# Patient Record
Sex: Female | Born: 1980
Health system: Southern US, Community
[De-identification: ages and names within clinical notes are randomized; demographics above are authoritative.]

## PROBLEM LIST (undated history)

## (undated) DIAGNOSIS — N870 Mild cervical dysplasia: Secondary | ICD-10-CM

## (undated) DIAGNOSIS — D649 Anemia, unspecified: Secondary | ICD-10-CM

## (undated) DIAGNOSIS — B977 Papillomavirus as the cause of diseases classified elsewhere: Secondary | ICD-10-CM

## (undated) HISTORY — DX: Papillomavirus as the cause of diseases classified elsewhere: B97.7

## (undated) HISTORY — DX: Mild cervical dysplasia: N87.0

## (undated) HISTORY — DX: Anemia, unspecified: D64.9

---

## 2004-10-29 ENCOUNTER — Other Ambulatory Visit: Admission: RE | Admit: 2004-10-29 | Discharge: 2004-10-29 | Payer: Self-pay | Admitting: Gynecology

## 2005-02-09 ENCOUNTER — Other Ambulatory Visit: Admission: RE | Admit: 2005-02-09 | Discharge: 2005-02-09 | Payer: Self-pay | Admitting: Gynecology

## 2005-05-28 ENCOUNTER — Inpatient Hospital Stay (HOSPITAL_COMMUNITY): Admission: AD | Admit: 2005-05-28 | Discharge: 2005-05-28 | Payer: Self-pay | Admitting: Gynecology

## 2005-06-02 ENCOUNTER — Inpatient Hospital Stay (HOSPITAL_COMMUNITY): Admission: AD | Admit: 2005-06-02 | Discharge: 2005-06-04 | Payer: Self-pay | Admitting: Gynecology

## 2005-07-13 ENCOUNTER — Other Ambulatory Visit: Admission: RE | Admit: 2005-07-13 | Discharge: 2005-07-13 | Payer: Self-pay | Admitting: Gynecology

## 2006-07-26 ENCOUNTER — Other Ambulatory Visit: Admission: RE | Admit: 2006-07-26 | Discharge: 2006-07-26 | Payer: Self-pay | Admitting: Gynecology

## 2006-08-03 ENCOUNTER — Encounter: Admission: RE | Admit: 2006-08-03 | Discharge: 2006-08-03 | Payer: Self-pay | Admitting: Gynecology

## 2006-08-03 ENCOUNTER — Encounter (INDEPENDENT_AMBULATORY_CARE_PROVIDER_SITE_OTHER): Payer: Self-pay | Admitting: Specialist

## 2006-11-13 ENCOUNTER — Other Ambulatory Visit: Admission: RE | Admit: 2006-11-13 | Discharge: 2006-11-13 | Payer: Self-pay | Admitting: Gynecology

## 2007-08-06 ENCOUNTER — Other Ambulatory Visit: Admission: RE | Admit: 2007-08-06 | Discharge: 2007-08-06 | Payer: Self-pay | Admitting: Gynecology

## 2008-08-14 ENCOUNTER — Ambulatory Visit: Payer: Self-pay | Admitting: Gynecology

## 2008-08-14 ENCOUNTER — Other Ambulatory Visit: Admission: RE | Admit: 2008-08-14 | Discharge: 2008-08-14 | Payer: Self-pay | Admitting: Gynecology

## 2008-08-14 ENCOUNTER — Encounter: Payer: Self-pay | Admitting: Gynecology

## 2009-07-08 ENCOUNTER — Ambulatory Visit: Payer: Self-pay | Admitting: Gynecology

## 2009-07-13 ENCOUNTER — Encounter: Admission: RE | Admit: 2009-07-13 | Discharge: 2009-07-13 | Payer: Self-pay | Admitting: Gynecology

## 2009-09-01 ENCOUNTER — Other Ambulatory Visit: Admission: RE | Admit: 2009-09-01 | Discharge: 2009-09-01 | Payer: Self-pay | Admitting: Gynecology

## 2009-09-01 ENCOUNTER — Ambulatory Visit: Payer: Self-pay | Admitting: Gynecology

## 2009-11-17 ENCOUNTER — Ambulatory Visit: Payer: Self-pay | Admitting: Gynecology

## 2010-08-14 ENCOUNTER — Emergency Department (INDEPENDENT_AMBULATORY_CARE_PROVIDER_SITE_OTHER): Payer: Managed Care, Other (non HMO)

## 2010-08-14 ENCOUNTER — Emergency Department (HOSPITAL_BASED_OUTPATIENT_CLINIC_OR_DEPARTMENT_OTHER)
Admission: EM | Admit: 2010-08-14 | Discharge: 2010-08-14 | Disposition: A | Discharge status: Home / Self Care | Payer: Managed Care, Other (non HMO) | Attending: Emergency Medicine | Admitting: Emergency Medicine

## 2010-08-14 DIAGNOSIS — R079 Chest pain, unspecified: Secondary | ICD-10-CM | POA: Insufficient documentation

## 2010-08-14 LAB — URINE MICROSCOPIC-ADD ON

## 2010-08-14 LAB — POCT CARDIAC MARKERS
CKMB, poc: <1 ng/mL — ABNORMAL LOW (ref 1.0–8.0)
CKMB, poc: <1 ng/mL — ABNORMAL LOW (ref 1.0–8.0)
Myoglobin, poc: 22 ng/mL (ref 12–200)
Myoglobin, poc: 14.8 ng/mL (ref 12–200)
Troponin i, poc: <0.05 ng/mL (ref 0.00–0.09)
Troponin i, poc: <0.05 ng/mL (ref 0.00–0.09)

## 2010-08-14 LAB — CBC
HCT: 36.6 % (ref 36.0–46.0)
Hemoglobin: 12.8 g/dL (ref 12.0–15.0)
MCH: 30.6 pg (ref 26.0–34.0)
MCHC: 35 g/dL (ref 30.0–36.0)
MCV: 87.6 fL (ref 78.0–100.0)
Platelets: 231 10*3/uL (ref 150–400)
RBC: 4.18 MIL/uL (ref 3.87–5.11)
RDW: 11.6 % (ref 11.5–15.5)
WBC: 8.3 10*3/uL (ref 4.0–10.5)

## 2010-08-14 LAB — DIFFERENTIAL
Basophils Absolute: 0 10*3/uL (ref 0.0–0.1)
Basophils Relative: 0 % (ref 0–1)
Eosinophils Absolute: 0.4 10*3/uL (ref 0.0–0.7)
Eosinophils Relative: 5 % (ref 0–5)
Lymphocytes Relative: 49 % — ABNORMAL HIGH (ref 12–46)
Lymphs Abs: 4 10*3/uL (ref 0.7–4.0)
Monocytes Absolute: 0.7 10*3/uL (ref 0.1–1.0)
Monocytes Relative: 8 % (ref 3–12)
Neutro Abs: 3.2 10*3/uL (ref 1.7–7.7)
Neutrophils Relative %: 39 % — ABNORMAL LOW (ref 43–77)

## 2010-08-14 LAB — COMPREHENSIVE METABOLIC PANEL
ALT: 32 U/L (ref 0–35)
AST: 25 U/L (ref 0–37)
Albumin: 4.7 g/dL (ref 3.5–5.2)
Alkaline Phosphatase: 85 U/L (ref 39–117)
BUN: 11 mg/dL (ref 6–23)
CO2: 27 mEq/L (ref 19–32)
Calcium: 9.3 mg/dL (ref 8.4–10.5)
Chloride: 104 mEq/L (ref 96–112)
Creatinine, Ser: 0.6 mg/dL (ref 0.4–1.2)
GFR calc Af Amer: >60 mL/min (ref >60)
GFR calc non Af Amer: >60 mL/min (ref >60)
Glucose, Bld: 101 mg/dL — ABNORMAL HIGH (ref 70–99)
Potassium: 3.4 mEq/L — ABNORMAL LOW (ref 3.5–5.1)
Sodium: 143 mEq/L (ref 135–145)
Total Bilirubin: 0.7 mg/dL (ref 0.3–1.2)
Total Protein: 8.6 g/dL — ABNORMAL HIGH (ref 6.0–8.3)

## 2010-08-14 LAB — URINALYSIS, ROUTINE W REFLEX MICROSCOPIC
Bilirubin Urine: NEGATIVE
Glucose, UA: NEGATIVE mg/dL
Ketones, ur: NEGATIVE mg/dL
Nitrite: NEGATIVE
Protein, ur: NEGATIVE mg/dL
Specific Gravity, Urine: 1.007 (ref 1.005–1.030)
Urobilinogen, UA: 0.2 mg/dL (ref 0.0–1.0)
pH: 8 (ref 5.0–8.0)

## 2010-08-14 LAB — PREGNANCY, URINE: Preg Test, Ur: NEGATIVE

## 2010-09-20 ENCOUNTER — Encounter: Payer: Self-pay | Admitting: Gynecology

## 2010-09-22 ENCOUNTER — Other Ambulatory Visit (HOSPITAL_COMMUNITY)
Admission: RE | Admit: 2010-09-22 | Discharge: 2010-09-22 | Disposition: A | Discharge status: Home / Self Care | Payer: Managed Care, Other (non HMO) | Source: Ambulatory Visit | Attending: Gynecology | Admitting: Gynecology

## 2010-09-22 ENCOUNTER — Encounter (INDEPENDENT_AMBULATORY_CARE_PROVIDER_SITE_OTHER): Payer: Managed Care, Other (non HMO) | Admitting: Gynecology

## 2010-09-22 ENCOUNTER — Other Ambulatory Visit: Payer: Self-pay | Admitting: Gynecology

## 2010-09-22 DIAGNOSIS — Z124 Encounter for screening for malignant neoplasm of cervix: Secondary | ICD-10-CM | POA: Insufficient documentation

## 2010-09-22 DIAGNOSIS — J069 Acute upper respiratory infection, unspecified: Secondary | ICD-10-CM

## 2010-09-22 DIAGNOSIS — L680 Hirsutism: Secondary | ICD-10-CM

## 2010-09-22 DIAGNOSIS — Z01419 Encounter for gynecological examination (general) (routine) without abnormal findings: Secondary | ICD-10-CM

## 2010-09-22 DIAGNOSIS — Z131 Encounter for screening for diabetes mellitus: Secondary | ICD-10-CM

## 2010-09-22 DIAGNOSIS — Z833 Family history of diabetes mellitus: Secondary | ICD-10-CM

## 2010-10-08 NOTE — H&P (Signed)
NAME:  Stephanie Mcdonald, Stephanie Mcdonald              ACCOUNT NO.:  [id number]   MEDICAL RECORD NO.:  [id number]          PATIENT TYPE:  MAT   LOCATION:  [facility]                          FACILITY:  [facility]   PHYSICIAN:  Timothy P. Fontaine, M.D.DATE OF BIRTH:  December 10, 1980   DATE OF ADMISSION:  06/02/2005  DATE OF DISCHARGE:                                HISTORY & PHYSICAL   CHIEF COMPLAINT:  Labor.   HISTORY OF PRESENT ILLNESS:  A 30 year old G2 P1 female at [redacted] weeks  gestation who enters with regular uterine contractions. She has no rupture  of membranes. Her prenatal course has been uncomplicated, noting a low-grade  SIL Pap smear with colposcopic confirmed biopsies first trimester, planned  postpartum follow-up. Her beta strep screen is negative. For the remainder  of her history, see her [facility].   PHYSICAL EXAMINATION:  HEENT:  Normal.  LUNGS:  Clear.  CARDIAC:  Regular rate, no rubs, murmurs, or gallops.  ABDOMEN:  Gravid, vertex fetus consistent with term. External monitors show  reactive fetal tracing with contractions every 5 minutes.  PELVIC:  Per nursing personnel, vertex presentation, cervix 3 cm dilated,  50% effaced, 0 station.   ASSESSMENT:  A 30 year old gravida 2 para 1 female at [redacted] weeks gestation,  early labor, contractions every 5 minutes, 3 cm dilatation, intact  membranes, beta strep negative. Will plan on admission, routine labor and  delivery orders.      Timothy P. Fontaine, M.D.  Electronically Signed     TPF/MEDQ  D:  06/02/2005  T:  06/02/2005  Job:  [id number]

## 2011-09-20 ENCOUNTER — Telehealth: Payer: Self-pay | Admitting: *Deleted

## 2011-09-20 ENCOUNTER — Other Ambulatory Visit: Payer: Self-pay | Admitting: *Deleted

## 2011-09-20 DIAGNOSIS — Z3049 Encounter for surveillance of other contraceptives: Secondary | ICD-10-CM

## 2011-09-20 MED ORDER — LEVONORGESTREL 20 MCG/24HR IU IUD: INTRAUTERINE_SYSTEM | Freq: Once | INTRAUTERINE | Status: DC

## 2011-09-20 NOTE — Telephone Encounter (Signed)
Patient informed Mirena IUD and insert is covered at 100%, but the removal is covered with a $25 copay.  Will collect that at the time she comes in.

## 2011-09-30 ENCOUNTER — Encounter: Payer: Managed Care, Other (non HMO) | Admitting: Gynecology

## 2011-10-07 ENCOUNTER — Encounter: Payer: Self-pay | Admitting: Gynecology

## 2011-10-07 ENCOUNTER — Ambulatory Visit (INDEPENDENT_AMBULATORY_CARE_PROVIDER_SITE_OTHER): Payer: BC Managed Care – PPO | Admitting: Gynecology

## 2011-10-07 VITALS — BP 118/70 | Ht 64.0 in | Wt 141.0 lb

## 2011-10-07 DIAGNOSIS — Z87898 Personal history of other specified conditions: Secondary | ICD-10-CM

## 2011-10-07 DIAGNOSIS — R7989 Other specified abnormal findings of blood chemistry: Secondary | ICD-10-CM

## 2011-10-07 DIAGNOSIS — Z30432 Encounter for removal of intrauterine contraceptive device: Secondary | ICD-10-CM

## 2011-10-07 DIAGNOSIS — Z8639 Personal history of other endocrine, nutritional and metabolic disease: Secondary | ICD-10-CM

## 2011-10-07 DIAGNOSIS — Z309 Encounter for contraceptive management, unspecified: Secondary | ICD-10-CM

## 2011-10-07 DIAGNOSIS — Z01419 Encounter for gynecological examination (general) (routine) without abnormal findings: Secondary | ICD-10-CM

## 2011-10-07 LAB — CBC WITH DIFFERENTIAL/PLATELET
Basophils Absolute: 0.1 10*3/uL (ref 0.0–0.1)
Basophils Relative: 1 % (ref 0–1)
Eosinophils Absolute: 0.5 10*3/uL (ref 0.0–0.7)
Eosinophils Relative: 8 % — ABNORMAL HIGH (ref 0–5)
HCT: 37.3 % (ref 36.0–46.0)
Hemoglobin: 12.8 g/dL (ref 12.0–15.0)
Lymphocytes Relative: 39 % (ref 12–46)
Lymphs Abs: 2.3 10*3/uL (ref 0.7–4.0)
MCH: 31.1 pg (ref 26.0–34.0)
MCHC: 34.3 g/dL (ref 30.0–36.0)
MCV: 90.8 fL (ref 78.0–100.0)
Monocytes Absolute: 0.4 10*3/uL (ref 0.1–1.0)
Monocytes Relative: 7 % (ref 3–12)
Neutro Abs: 2.6 10*3/uL (ref 1.7–7.7)
Neutrophils Relative %: 45 % (ref 43–77)
Platelets: 242 10*3/uL (ref 150–400)
RBC: 4.11 MIL/uL (ref 3.87–5.11)
RDW: 13.4 % (ref 11.5–15.5)
WBC: 5.8 10*3/uL (ref 4.0–10.5)

## 2011-10-07 LAB — LIPID PANEL
Cholesterol: 141 mg/dL (ref 0–200)
HDL: 67 mg/dL (ref >39)
LDL Cholesterol: 64 mg/dL (ref 0–99)
Total CHOL/HDL Ratio: 2.1 Ratio
Triglycerides: 52 mg/dL (ref <150)
VLDL: 10 mg/dL (ref 0–40)

## 2011-10-07 NOTE — Progress Notes (Addendum)
Stephanie Mcdonald [birth year] [id number]   History:    31 y.o.  for annual exam who had stated that back in March she went the [facility] because of several weeks history of left upper quadrant pain on and off as well as feeling bloated with her meals. She brought with her all her labs which were normal: Vitamin B12, lipid profile, vitamin D, TSH, and CBC. Her comprehensive metabolic panel had indicated that her SGPT was elevated at 101 (normal 7-52) and SGOT was elevated at 58 (normal 13-39). She was instructed to return back in 3 months for followup liver function tests. I would like to continue to be followed up here for further evaluation. She denies any nausea vomiting any change in bowel movement habits. She did have a Mirena IUD placed in 2008 and is due to be removed today.  Past medical history,surgical history, family history and social history were all reviewed and documented in the EPIC chart.  Gynecologic History Patient's last menstrual period was 10/01/2011. Contraception: IUD Last Pap: 2012. Results were: normal Last mammogram: Not indicated. Results were: Not indicated  Obstetric History OB History    Grav Para Term Preterm Abortions TAB SAB Ect Mult Living   2 2 2       2      # Outc Date GA Lbr Len/2nd Wgt Sex Del Anes PTL Lv   1 TRM     F SVD  No Yes   2 TRM     F SVD  No Yes       ROS: A ROS was performed and pertinent positives and negatives are included in the history.  GENERAL: No fevers or chills. HEENT: No change in vision, no earache, sore throat or sinus congestion. NECK: No pain or stiffness. CARDIOVASCULAR: No chest pain or pressure. No palpitations. PULMONARY: No shortness of breath, cough or wheeze. GASTROINTESTINAL: No abdominal pain, nausea, vomiting or diarrhea, melena or bright red blood per rectum. GENITOURINARY: No urinary frequency, urgency, hesitancy or dysuria. MUSCULOSKELETAL: No joint or muscle pain, no back pain, no recent trauma.  DERMATOLOGIC: No rash, no itching, no lesions. ENDOCRINE: No polyuria, polydipsia, no heat or cold intolerance. No recent change in weight. HEMATOLOGICAL: No anemia or easy bruising or bleeding. NEUROLOGIC: No headache, seizures, numbness, tingling or weakness. PSYCHIATRIC: No depression, no loss of interest in normal activity or change in sleep pattern.     Exam: chaperone present  BP 118/70  Ht 5\' 4"  (1.626 m)  Wt 141 lb (63.957 kg)  BMI 24.20 kg/m2  LMP 10/01/2011  Body mass index is 24.20 kg/(m^2).  General appearance : Well developed well nourished female. No acute distress HEENT: Neck supple, trachea midline, no carotid bruits, no thyroidmegaly Lungs: Clear to auscultation, no rhonchi or wheezes, or rib retractions  Heart: Regular rate and rhythm, no murmurs or gallops Breast:Examined in sitting and supine position were symmetrical in appearance, no palpable masses or tenderness,  no skin retraction, no nipple inversion, no nipple discharge, no skin discoloration, no axillary or supraclavicular lymphadenopathy Abdomen: no palpable masses or tenderness, no rebound or guarding Extremities: no edema or skin discoloration or tenderness  Pelvic:  Bartholin, Urethra, Skene Glands: Within normal limits             Vagina: No gross lesions or discharge  Cervix: No gross lesions or discharge, IUD string seen  Uterus  anteverted, normal size, shape and consistency, non-tender and mobile  Adnexa  Without masses or tenderness  Anus and  perineum  normal   Rectovaginal  normal sphincter tone without palpated masses or tenderness             Hemoccult not done     Assessment/Plan:  31 y.o. female for annual exam with 3-1/2 months of left upper quadrant pain on and off with or without meals. She does state that sometimes 2 after meter she does feel bloated. Were going to schedule an upper abdominal ultrasound and then in 2 weeks to return to the office for repeat her SGOT and SGPT. Patient  states that she does not drink any alcohol or abuse of aspirin or nonsteroidals. We'll see of removing the Mirena IUD since it has progesterone wall making changes on her LFTs when they are repeated in 2 weeks. She is fasting today her CBC, fasting lipid profile and urinalysis was obtained today along with vitamin D level since she has had history vitamin D deficiency in the past. If her LFTs are still elevated or if they are normal but she still has pain despite a normal ultrasound we'll refer her to the gastroenterologist for further evaluation.All the above was discussed in Spanish and we'll follow accordingly.  Of note: Patient's IUD was removed in a sterile fashion shown to the patient discarded. She will use barrier contraception until she returned back in 2 Weeks to Pl. a ParaGard T380A IUD and have her blood drawn at the same time. We'll make arrangements for her to have her upper abdominal ultrasound next week.   Ok Edwards MD, 12:50 PM 10/07/2011

## 2011-10-07 NOTE — Patient Instructions (Signed)
Usar condones hasta que venga para inserta despositivo nuevo.

## 2011-10-08 LAB — URINALYSIS W MICROSCOPIC + REFLEX CULTURE
Bilirubin Urine: NEGATIVE
Casts: NONE SEEN
Crystals: NONE SEEN
Glucose, UA: NEGATIVE mg/dL
Hgb urine dipstick: NEGATIVE
Ketones, ur: NEGATIVE mg/dL
Leukocytes, UA: NEGATIVE
Nitrite: NEGATIVE
Protein, ur: NEGATIVE mg/dL
Specific Gravity, Urine: 1.019 (ref 1.005–1.030)
Urobilinogen, UA: 0.2 mg/dL (ref 0.0–1.0)
pH: 6.5 (ref 5.0–8.0)

## 2011-10-08 LAB — VITAMIN D 25 HYDROXY (VIT D DEFICIENCY, FRACTURES): Vit D, 25-Hydroxy: 41 ng/mL (ref 30–89)

## 2011-10-11 ENCOUNTER — Ambulatory Visit: Payer: Managed Care, Other (non HMO) | Admitting: Gynecology

## 2011-10-12 ENCOUNTER — Other Ambulatory Visit: Payer: Self-pay | Admitting: *Deleted

## 2011-10-12 ENCOUNTER — Telehealth: Payer: Self-pay | Admitting: *Deleted

## 2011-10-12 DIAGNOSIS — R1010 Upper abdominal pain, unspecified: Secondary | ICD-10-CM

## 2011-10-12 NOTE — Telephone Encounter (Signed)
Message copied by Richardson Chiquito on Wed Oct 12, 2011 10:51 AM ------      Message from: Ok Edwards      Created: Fri Oct 07, 2011 12:56 PM       Please schedule upper abdominal ultrasound on this patient who is had 3 months of left upper quadrant pain. Patient would prefer a morning appointment. Also please schedule for an office visit in 2 Weeks to place a ParaGard T380A IUD at the same time that she comes in to get her blood drawn.

## 2011-10-12 NOTE — Telephone Encounter (Signed)
ABDOMEN US AT [facility] [address] 5/24 arrive  915am. Pt informed. Also informed paragard covered 100% appt scheduled. Kw

## 2011-10-14 ENCOUNTER — Ambulatory Visit
Admission: RE | Admit: 2011-10-14 | Discharge: 2011-10-14 | Disposition: A | Discharge status: Home / Self Care | Payer: BC Managed Care – PPO | Source: Ambulatory Visit | Attending: Gynecology | Admitting: Gynecology

## 2011-10-14 DIAGNOSIS — R1010 Upper abdominal pain, unspecified: Secondary | ICD-10-CM

## 2011-10-25 ENCOUNTER — Ambulatory Visit (INDEPENDENT_AMBULATORY_CARE_PROVIDER_SITE_OTHER): Payer: BC Managed Care – PPO | Admitting: Gynecology

## 2011-10-25 ENCOUNTER — Encounter: Payer: Self-pay | Admitting: Gynecology

## 2011-10-25 VITALS — BP 118/72

## 2011-10-25 DIAGNOSIS — Z30430 Encounter for insertion of intrauterine contraceptive device: Secondary | ICD-10-CM

## 2011-10-25 DIAGNOSIS — N912 Amenorrhea, unspecified: Secondary | ICD-10-CM

## 2011-10-25 DIAGNOSIS — R7401 Elevation of levels of liver transaminase levels: Secondary | ICD-10-CM

## 2011-10-25 DIAGNOSIS — R748 Abnormal levels of other serum enzymes: Secondary | ICD-10-CM

## 2011-10-25 DIAGNOSIS — R1013 Epigastric pain: Secondary | ICD-10-CM | POA: Insufficient documentation

## 2011-10-25 DIAGNOSIS — Z3049 Encounter for surveillance of other contraceptives: Secondary | ICD-10-CM

## 2011-10-25 LAB — ALT: ALT: 28 U/L (ref 0–35)

## 2011-10-25 LAB — AST: AST: 25 U/L (ref 0–37)

## 2011-10-25 NOTE — Progress Notes (Signed)
Patient 31 year old who presented to the office today to place the ParaGard T380A IUD. She stated that in March she had gone to her family practice office because of complaint of left upper quadrant pain that was on and off as well as bloating sensation associated with meals. She had brought her labs and her vitamin B12, lipid profile, vitamin D level, TSH, and CBC were all normal. Her comprehensive metabolic panel indicated that the SGPT and SGOT were elevated. She was to have the labs repeated in 3 months. A few weeks ago she had a Mirena IUD removed in the office. She states that her symptoms are far and in between. Urine pregnancy test negative today  Procedure note: Patient was counseled as to the risks benefits pros and cons of the ParaGard T380A IUD. Patient's fully aware that this form of contraception is 99% effective and is good for 10 years.  Exam: Abdomen soft nontender no rebound or guarding Pelvic: Stephanie Mcdonald Skene was within normal limits Vagina: No lesions or discharge Cervix: No lesions or discharge Uterus: Anteverted normal size shape and consistency Adnexa: No palpable masses or tenderness Rectal: Not examined  The cervix was cleansed with Betadine solution and a single-tooth tenaculum was placed on the anterior cervical lip. The uterus sounded to 7-1/2 cm and the ParaGard T380A IUD was placed in a sterile fashion. The single-tooth tenaculum was removed.  Patient will stop by the lab we'll check an SGOT and SGPT to followup since she was last tested 3 months ago will also check H. pylori screen today. Will await results and his symptoms continue she will be referred to the gastroenterologist. All the above was discussed with the patient Spanish and we'll follow accordingly. Patient to return to the office in one month for followup.

## 2011-10-25 NOTE — Patient Instructions (Signed)
Yo la llamo si los resultados estan anormales y si la tengo que referir al gastroenterologo por los dolores abdominales.  Informacin sobre el dispositivo intrauterino  (Intrauterine Device Information) El dispositivo intrauterino (DIU) se inserta en el tero e impide el embarazo. Hay dos tipos de DIU:   DIU de cobre. Este tipo de DIU est recubierto con un alambre de cobre y se inserta dentro del tero. El cobre hace que el tero y las trompas de [place] produzcan un liquido que [employer] espermatozoides. El DIU de cobre puede Geneticist, molecular durante 10 aos.   DIU hormonal. Este tipo de DIU contiene la hormona progestina (progesterona sinttica). La hormona espesa el moco cervical y evita que los espermatozoides ingresen al tero y tambin afina la membrana que cubre el tero para evitar la implantacin del vulo fertilizado. La hormona debilita o destruye los espermatozoides que ingresan al tero. El DIU hormonal puede Geneticist, molecular durante 5 aos.  El mdico se asegurar de que usted es una buena candidata para usar el DIU cono anticonceptivo. Hable con su mdico acerca de los posibles efectos secundarios.  VENTAJAS  Es muy eficaz, reversible, de accin prolongada y de bajo mantenimiento.   No hay efectos secundarios relacionados con el estrgeno.   El DIU puede ser utilizado durante la Market [employer].   No est asociado con el aumento de [place].   Funciona inmediatamente despus de la insercin.   El DIU de cobre no interfiere con las hormonas femeninas.   El DIU con progesterona puede hacer que los perodos menstruales no sean tan abundantes.   El DIU de progesterona puede usarse durante 5 aos.   El DIU de cobre puede usarse durante 10 aos.  DESVENTAJAS  El DIU de progesterona puede estar asociado con patrones de sangrado irregular.   El DIU de cobre puede hacer que el flujo menstrual ms abundante y doloroso.   Puede experimentar clicos y sangrado vaginal  despus de la insercin.  Document Released: 10/27/2009 Document Revised: 04/28/2011 [facility] Patient Information 2012 [place], [place].

## 2011-10-26 LAB — PREGNANCY, URINE: Preg Test, Ur: NEGATIVE

## 2011-10-26 LAB — H. PYLORI ANTIBODY, IGG: H Pylori IgG: 0.53 {ISR}

## 2011-11-22 ENCOUNTER — Ambulatory Visit: Payer: BC Managed Care – PPO | Admitting: Gynecology

## 2011-11-28 ENCOUNTER — Encounter: Payer: Self-pay | Admitting: Gynecology

## 2011-11-28 ENCOUNTER — Ambulatory Visit (INDEPENDENT_AMBULATORY_CARE_PROVIDER_SITE_OTHER): Payer: BC Managed Care – PPO | Admitting: Gynecology

## 2011-11-28 VITALS — BP 118/70

## 2011-11-28 DIAGNOSIS — Z30431 Encounter for routine checking of intrauterine contraceptive device: Secondary | ICD-10-CM

## 2011-11-28 NOTE — Progress Notes (Signed)
Patient presents to the office for one month for followup after having the ParaGard T380A IUD changed.She stated that in March she had gone to her family practice office because of complaint of left upper quadrant pain that was on and off as well as bloating sensation associated with meals. She had brought her labs and her vitamin B12, lipid profile, vitamin D level, TSH, and CBC were all normal. Her comprehensive metabolic panel indicated that the SGPT and SGOT were elevated. She was to have the labs repeated in 3 months. Her SGOT and SGPT were repeated here in the office on June 4 and was normal. She also had an ultrasound done on May 24 with the following result:   RADIOLOGY REPORT*  Clinical Data: Left upper quadrant abdominal pain and swelling.  LIMITED ABDOMINAL ULTRASOUND  Comparison: None.  Findings: Spleen is normal in size and echotexture.  Left kidney measures 12.6 cm. No hydronephrosis.  No left upper quadrant mass identified. No ascites.  IMPRESSION:  Normal appearance of the spleen and left kidney. No explanation  for left-sided pain.  Patient still has the upper abdominal discomfort on and off an on going to refer to the gastroenterologist for further evaluation. She is seen today for an IUD followup. She is otherwise doing well.  Pelvic: Bartholin urethra Skene was within normal limits Vagina: No lesions or discharge Cervix: IUD string seen Uterus: Anteverted normal size shape and consistency Adnexa: No palpable masses or tenderness Rectal: Not examined  Assessment/plan: Patient will be referred to the gastroenterologist for her persistence of upper left quadrant abdominal pains. Patient status post placement of ParaGard T380A IUD which is good for 10 years is doing well otherwise.

## 2011-12-05 ENCOUNTER — Ambulatory Visit: Payer: BC Managed Care – PPO | Admitting: Gynecology

## 2012-10-08 ENCOUNTER — Other Ambulatory Visit: Payer: Self-pay | Admitting: Gynecology

## 2012-10-08 ENCOUNTER — Encounter: Payer: Self-pay | Admitting: Gynecology

## 2012-10-08 ENCOUNTER — Ambulatory Visit (INDEPENDENT_AMBULATORY_CARE_PROVIDER_SITE_OTHER): Payer: BC Managed Care – PPO | Admitting: Gynecology

## 2012-10-08 VITALS — BP 126/84 | Ht 64.0 in | Wt 148.0 lb

## 2012-10-08 DIAGNOSIS — Z01419 Encounter for gynecological examination (general) (routine) without abnormal findings: Secondary | ICD-10-CM

## 2012-10-08 DIAGNOSIS — Z8639 Personal history of other endocrine, nutritional and metabolic disease: Secondary | ICD-10-CM

## 2012-10-08 DIAGNOSIS — Z23 Encounter for immunization: Secondary | ICD-10-CM

## 2012-10-08 DIAGNOSIS — D241 Benign neoplasm of right breast: Secondary | ICD-10-CM | POA: Insufficient documentation

## 2012-10-08 DIAGNOSIS — R635 Abnormal weight gain: Secondary | ICD-10-CM

## 2012-10-08 DIAGNOSIS — Z87410 Personal history of cervical dysplasia: Secondary | ICD-10-CM

## 2012-10-08 LAB — TSH: TSH: 2.227 u[IU]/mL (ref 0.350–4.500)

## 2012-10-08 LAB — CBC WITH DIFFERENTIAL/PLATELET
Basophils Absolute: 0 10*3/uL (ref 0.0–0.1)
Basophils Relative: 1 % (ref 0–1)
Eosinophils Absolute: 0.5 10*3/uL (ref 0.0–0.7)
Eosinophils Relative: 8 % — ABNORMAL HIGH (ref 0–5)
HCT: 36.1 % (ref 36.0–46.0)
Hemoglobin: 12.1 g/dL (ref 12.0–15.0)
Lymphocytes Relative: 42 % (ref 12–46)
Lymphs Abs: 2.5 10*3/uL (ref 0.7–4.0)
MCH: 28.7 pg (ref 26.0–34.0)
MCHC: 33.5 g/dL (ref 30.0–36.0)
MCV: 85.5 fL (ref 78.0–100.0)
Monocytes Absolute: 0.4 10*3/uL (ref 0.1–1.0)
Monocytes Relative: 7 % (ref 3–12)
Neutro Abs: 2.5 10*3/uL (ref 1.7–7.7)
Neutrophils Relative %: 42 % — ABNORMAL LOW (ref 43–77)
Platelets: 229 10*3/uL (ref 150–400)
RBC: 4.22 MIL/uL (ref 3.87–5.11)
RDW: 14.5 % (ref 11.5–15.5)
WBC: 5.9 10*3/uL (ref 4.0–10.5)

## 2012-10-08 LAB — URINALYSIS W MICROSCOPIC + REFLEX CULTURE
Bilirubin Urine: NEGATIVE
Casts: NONE SEEN
Crystals: NONE SEEN
Glucose, UA: NEGATIVE mg/dL
Ketones, ur: NEGATIVE mg/dL
Nitrite: NEGATIVE
Protein, ur: NEGATIVE mg/dL
Specific Gravity, Urine: 1.02 (ref 1.005–1.030)
Urobilinogen, UA: 0.2 mg/dL (ref 0.0–1.0)
pH: 7 (ref 5.0–8.0)

## 2012-10-08 LAB — COMPREHENSIVE METABOLIC PANEL
ALT: 31 U/L (ref 0–35)
AST: 20 U/L (ref 0–37)
Albumin: 4.3 g/dL (ref 3.5–5.2)
Alkaline Phosphatase: 70 U/L (ref 39–117)
BUN: 7 mg/dL (ref 6–23)
CO2: 25 mEq/L (ref 19–32)
Calcium: 9.3 mg/dL (ref 8.4–10.5)
Chloride: 103 mEq/L (ref 96–112)
Creat: 0.59 mg/dL (ref 0.50–1.10)
Glucose, Bld: 103 mg/dL — ABNORMAL HIGH (ref 70–99)
Potassium: 3.8 mEq/L (ref 3.5–5.3)
Sodium: 135 mEq/L (ref 135–145)
Total Bilirubin: 0.4 mg/dL (ref 0.3–1.2)
Total Protein: 7.1 g/dL (ref 6.0–8.3)

## 2012-10-08 NOTE — Progress Notes (Signed)
Stephanie Mcdonald 1981-02-20 [id number]   History:    32 y.o.  for annual gyn exam with occasional postcoital spotting as a result of her recently place ParaGard T380A IUD. Patient had a Mirena IUD prior to that but was changed because her liver function tests were found to be elevated. On followup 3 months after the Mirena IUD was removed her SGOT and SGPT were back to normal. Patient had a right breast fibroadenoma on biopsy in 2008. Patient does not examine her breasts on a regular basis. 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 Subsequent Pap smears have been normal. Patient completed Gardasil Vaccine series in 2008. Patient has not received the Tdap vaccine as of yet. She has had history in the past vitamin D deficiency and is currently taking calcium and vitamin D.  Past medical history,surgical history, family history and social history were all reviewed and documented in the EPIC chart.  Gynecologic History Patient's last menstrual period was 08/31/2012. Contraception: IUD Last Pap: 2012. Results were: normal Last mammogram: see above. Results were: see above  Obstetric History OB History   Grav Para Term Preterm Abortions TAB SAB Ect Mult Living   2 2 2       2      # Outc Date GA Lbr Len/2nd Wgt Sex Del Anes PTL Lv   1 TRM     F SVD  No Yes   2 TRM     F SVD  No Yes       ROS: A ROS was performed and pertinent positives and negatives are included in the history.  GENERAL: No fevers or chills. HEENT: No change in vision, no earache, sore throat or sinus congestion. NECK: No pain or stiffness. CARDIOVASCULAR: No chest pain or pressure. No palpitations. PULMONARY: No shortness of breath, cough or wheeze. GASTROINTESTINAL: No abdominal pain, nausea, vomiting or diarrhea, melena or bright red blood per rectum. GENITOURINARY: No urinary frequency, urgency, hesitancy or dysuria. MUSCULOSKELETAL: No joint or muscle pain, no back pain, no  recent trauma. DERMATOLOGIC: No rash, no itching, no lesions. ENDOCRINE: No polyuria, polydipsia, no heat or cold intolerance. No recent change in weight. HEMATOLOGICAL: No anemia or easy bruising or bleeding. NEUROLOGIC: No headache, seizures, numbness, tingling or weakness. PSYCHIATRIC: No depression, no loss of interest in normal activity or change in sleep pattern.     Exam: chaperone present  BP 126/84  Ht 5\' 4"  (1.626 m)  Wt 148 lb (67.132 kg)  BMI 25.39 kg/m2  LMP 08/31/2012  Body mass index is 25.39 kg/(m^2).  General appearance : Well developed well nourished female. No acute distress HEENT: Neck supple, trachea midline, no carotid bruits, no thyroidmegaly Lungs: Clear to auscultation, no rhonchi or wheezes, or rib retractions  Heart: Regular rate and rhythm, no murmurs or gallops Breast:Examined in sitting and supine position were symmetrical in appearance, no palpable masses or tenderness,  no skin retraction, no nipple inversion, no nipple discharge, no skin discoloration, no axillary or supraclavicular lymphadenopathy Abdomen: no palpable masses or tenderness, no rebound or guarding Extremities: no edema or skin discoloration or tenderness  Pelvic:  Bartholin, Urethra, Skene Glands: Within normal limits             Vagina: No gross lesions or discharge  Cervix: No gross lesions or discharge, IUD string seen  Uterus  anteverted, normal size, shape and consistency, non-tender and mobile  Adnexa  Without masses or tenderness  Anus  and perineum  normal   Rectovaginal  normal sphincter tone without palpated masses or tenderness             Hemoccult not indicated     Assessment/Plan:  32 y.o. female for annual exam will receive the Tdap vaccine today. Patient with history of CIN-1 treated with cryotherapy in 2008 with subsequent Pap smears normal. Her last Pap smear was in 2012 so she will not need a Pap smear today as per the new guidelines. She was reminded to do her  monthly self breast examination. We discussed importance of diet and regular exercise. The following labs were ordered today: Screening cholesterol, comprehensive metabolic panel, TSH, CBC, urinalysis, and vitamin D.    Ok Edwards MD, 10:28 AM 10/08/2012

## 2012-10-08 NOTE — Addendum Note (Signed)
Addended by: Bertram Savin A on: 10/08/2012 11:13 AM   Modules accepted: Orders

## 2012-10-08 NOTE — Patient Instructions (Addendum)
Breast Self-Awareness  Practicing breast self-awareness may pick up problems early, prevent significant medical complications, and possibly save your life. By practicing breast self-awareness, you can become familiar with how your breasts look and feel and if your breasts are changing. This allows you to notice changes early. It can also offer you some reassurance that your breast health is good. One way to learn what is normal for your breasts and whether your breasts are changing is to do a breast self-exam.  If you find a lump or something that was not present in the past, it is best to contact your caregiver right away. Other findings that should be evaluated by your caregiver include nipple discharge, especially if it is bloody; skin changes or reddening; areas where the skin seems to be pulled in (retracted); or new lumps and bumps. Breast pain is seldom associated with cancer (malignancy), but should also be evaluated by a caregiver.  BREAST SELF-EXAM  The best time to examine your breasts is 5 7 days after your menstrual period is over. During menstruation, the breasts are lumpier, and it may be more difficult to pick up changes. If you do not menstruate, have reached menopause, or had your uterus removed (hysterectomy), you should examine your breasts at regular intervals, such as monthly. If you are breastfeeding, examine your breasts after a feeding or after using a breast pump. Breast implants do not decrease the risk for lumps or tumors, so continue to perform breast self-exams as recommended. Talk to your caregiver about how to determine the difference between the implant and breast tissue. Also, talk about the amount of pressure you should use during the exam. Over time, you will become more familiar with the variations of your breasts and more comfortable with the exam. A breast self-exam requires you to remove all your clothes above the waist.    Look at your breasts and nipples. Stand in front of  a mirror in a room with good lighting. With your hands on your hips, push your hands firmly downward. Look for a difference in shape, contour, and size from one breast to the other (asymmetry). Asymmetry includes puckers, dips, or bumps. Also, look for skin changes, such as reddened or scaly areas on the breasts. Look for nipple changes, such as discharge, dimpling, repositioning, or redness.   Carefully feel your breasts. This is best done either in the shower or tub while using soapy water or when flat on your back. Place the arm (on the side of the breast you are examining) above your head. Use the pads (not the fingertips) of your three middle fingers on your opposite hand to feel your breasts. Start in the underarm area and use  inch (2 cm) overlapping circles to feel your breast. Use 3 different levels of pressure (light, medium, and firm pressure) at each circle before moving to the next circle. The light pressure is needed to feel the tissue closest to the skin. The medium pressure will help to feel breast tissue a little deeper, while the firm pressure is needed to feel the tissue close to the ribs. Continue the overlapping circles, moving downward over the breast until you feel your ribs below your breast. Then, move one finger-width towards the center of the body. Continue to use the  inch (2 cm) overlapping circles to feel your breast as you move slowly up toward the collar bone (clavicle) near the base of the neck. Continue the up and down exam using all 3 pressures   the chest. Do this with each breast, carefully feeling for lumps or changes.  Keep a written record with breast changes or normal findings for each breast. By writing this information down, you do not need to depend only on memory for size, tenderness, or location. Write down where you are in your menstrual cycle, if you are still menstruating.  Breast tissue can have some lumps or thick tissue. However,  see your caregiver if you find anything that concerns you.  SEEK MEDICAL CARE IF:  You see a change in shape, contour, or size of your breasts or nipples.   You see skin changes, such as reddened or scaly areas on the breasts or nipples.   You have an unusual discharge from your nipples.   You feel a new lump or unusually thick areas.  Document Released: 05/09/2005 Document Revised: 11/08/2011 Document Reviewed: 08/24/2011 [facility] Patient Information 2013 [place], [place]. Vacuna difteria/ttanos (Td) o [place] difteria, ttanos, tos convulsa (Tdap), Lo que debe saber (Tetanus, Diphtheria [Td] or Tetanus, Diphtheria, Pertussis [Tdap] Vaccine, What You Need to Know) PORQU VACUNARSE? El ttanos , la difteria y la tos ferina pueden ser enfermedades graves.  El TTANOS  (trismo) provoca la contraccin dolorosa y rigidez de los msculos, por lo general, en todo el cuerpo.   Puede causar la contraccin de los msculos de la cabeza y el cuello de modo que el enfermo no puede abrir la boca ni tragar., y en algunos casos, tampoco puede respirar.. El ttanos causa la muerte de 1 de cada 5 personas que se infectan. LA DIFTERIA produce la formacin de una membrana gruesa que cubre el fondo de la garganta.  Puede causar problemas respiratorios, parlisis, insuficiencia cardaca, e incluso la muerte. El PERTUSIS (tos [place]) causa ataques de tos intensa que pueden dificultar la respiracin, provocar vmitos e interrumpir el sueo.   Puede causar prdida de peso, incontinencia, fractura de [place], y desmayos por la intensa tos. Hasta de 2 de cada 100 adolescentes y 5 de cada 100 adultos que enferman de tos [place] deben ser hospitalizados o tienen complicaciones como la neumona y la [place]. Estas 3 enfermedades son provocadas por bacterias. La difteria y la tos Benetta Spar se [place] de persona a Social worker. El ttanos ingresa al organismo a travs de cortes, rasguos o heridas. En los Estados [place]idos  ocurran alrededor de 200 000 casos por ao de difteria y tos [place], antes de que existieran las [place], y tambin ocurran cientos de casos de ttanos. Desde la aparicin de las vacunas, el ttanos y la difteria han disminuido en alrededor del 99% y los casos de tos ferina disminuyeron aproximadamente el 92%.  Los nios menores de 6 aos deben recibir la vacuna DTaP para estar protegidos contra estas tres enfermedades. Pero los [employer] Laboratories, los adolescentes y los adultos tambin necesitan proteccin. VACUNAS PARA ADOLESCENTES Y ADULTOS Vacunas Tdap y Td  Hay dos vacunas disponibles para proteger de estas enfermedades a nios a Glass blower/designer de los 7aos:   La vacuna Td fue utilizada durante muchos aos. Protege contra el ttanos y la difteria.  La vacuna Tdap fue autorizada en 2005. Es la primera vacuna para adolescentes y adultos que protege contra la tos ferina y el ttanos y la difteria. Una dosis de refuerzo de la Td se recomienda cada 10 aos. La Tdap se aplica slo una vez.  QU VACUNA DEBO APLICARME Y CUANDO? Las edades de 7 a 18 aos  Dynegy 11 y los 12 aos se recomienda una dosis  de Tdap. Esta dosis puede aplicarse desde los 7 aos en los nios que no han recibido una o ms dosis de DTaP anteriormente.  Los nios y adolescentes que no recibieron todas las dosis programadas de DTaP o DTP a los 7 aos deben completar la serie usando una combinacin de Td y Tdap. Adultos de 19 aos o ms  [employer] adultos deben recibir una dosis de refuerzo de Td cada 10 aos. Los adultos de menos de 65 aos que nunca hayan recibido la Tdap deben reemplazarla por la siguiente dosis de refuerzo. Los adultos a partir de los 65 aos puedenrecibir una dosis de Tdap.  Los adultos (incluyendo las mujeres que podran quedar embarazadas y los adultos mayores de 65 aos) que tienen contacto cercano con un beb menor de 12 meses deben aplicarse una dosis de Tdap para proteger al beb de la tos [place].  Los  trabajadores de la salud que tengan contacto directo con pacientes en hospitales o clnicas deben recibir una dosis de Tdap. Proteccin despus de [place] herida  Es posible que una persona que tenga un corte o quemadura grave necesite una dosis de Td o Tdap para prevenir la infeccin por ttanos. Puede usarse la Tdap en personas que nunca recibieron una dosis. Pero debe usarse la Td, si la Tdap no se encuentra disponible, o para:  Cualquier persona que haya recibido una dosis de Tdap.  Los nios [employer] 7 y los 9 aos que han C.H. Robinson Worldwide series de DTap anteriormente.  Adultos de 65 aos o ms. Mujeres embarazadas.   Las mujeres embarazadas que nunca recibieron una dosis de Ddap deben recibirla despus de la 20a semana de gestacin y preferiblemente durante Contractor. trimestre. Si no se aplican la Tdap durante el embarazo, deben recibirla lo antes posible despus del parto. Las mujeres embarazadas que han recibido la Tdap y tienen que aplicarse la vacuna contra el ttanos o la difteria durante el [place], deben recibir la Td. Las vacunas Tdap y Td pueden ser administradas al mismo tiempo que otras vacunas. ALGUNAS PERSONAS NO DEBEN RECIBIR LA VACUNA O DEBEN [employer]ard  Las personas que hayan tenido una reaccin alrgica que haya puesto en peligro su vida despus de una dosis de vacuna contra el ttanos, la difteria o la tos ferina no deben recibir Td ni Tdap..  Las personas que tengan alergias graves a algn componente de una vacuna no deben recibir esa vacuna. Informe a su mdico si la persona que recibe la vacuna sufre alergias graves.  Cualquier persona que American Standard Companies en coma o que haya tenido convulsiones dentro de los 7 [address]  [address] posteriores despus de una dosis de DTP o DTaP no debe recibir la Tdap, salvo que se encuentre una causa que no fuera la vacuna. Estas personas pueden recibir Td.  Consulte a su mdico si la persona que recibe [place] de las vacunas:  Tiene epilepsia o algn otro  problema del sistema nervioso.  Tuvo inflamacin o dolor intenso despus de una dosis de DTP, DTaP, DT, Td, o Tdap.  Ha tenido el sndrome de Scientific laboratory technician (GBS por sus siglas en ingls). Las personas que sufran una enfermedad moderada o grave el da en que se programa la vacuna, deben esperar a recuperarse para recibir las vacunas Tdap o Td. Por lo general, una persona con una enfermedad leve o fiebre baja puede recibir la vacuna. CULES SON LOS RIESGOS DE LAS VACUNAS TDAP Y TD? Con una vacuna, al igual que con cualquier Automatic Data, siempre  existe un pequeo riesgo de una reaccin alrgica que ponga en peligro la vida o cause otro problema grave. Todo procedimiento mdico, inclusive la vacunacin pueden causar breves episodios de lipotimia o sntomas relacionados (como movimientos espasmdicos). Para evitar los [employer] y las lesiones causadas por las cadas, permanezca sentado o recustese durante los 15 minutos posteriores a la vacunacin. Informe a su mdico si el paciente se siente dbil o mareado, tiene cambios en la visin o siente zumbidos en los odos.  Es mucho ms probable que tener ttanos, difteria, o tos ferina cause problemas ms graves que los provocados por recibir cualquiera de las vacunas Td o Tdap. A continuacin se enumeran los problemas informados despus de las vacunas Td y Tdap. Problemas Leves (perceptibles, pero que no interfirieron con las actividades): Tdap  Dolor (alrededor de 3 de cada 4 adolescentes y 2 de cada 3 adultos).  Enrojecimiento o inflamacin en el sitio de la inyeccin (alrededor de 1 de cada 5).  Fiebre leve de al menos 100.4 F (38 C) (hasta alrededor de 1 cada 25 adolescentes y 1 de cada 100 adultos).  Dolor de cabeza (alrededor de 4 de cada 10 adolescentes y 3 de cada 10 adultos).  Cansancio (alrededor de 1 de cada 3 adolescentes y 1 de cada 4 adultos).  Nuseas, vmitos, diarrea, o dolor de estmago (hasta 1 de cada 4 adolescentes y 1 de cada  10 adultos).  Escalofros, dolores corporales, dolor articular, erupciones, o inflamacin de las glndulas (poco frecuente). Td  Dolor (hasta alrededor de 8 de cada 10).  Enrojecimiento o inflamacin de la inyeccin (alrededor de 1 de cada 3).  Fiebre leve (hasta alrededor de 1 de cada 5).  Dolor de cabeza o cansancio (poco frecuente). Problemas Moderados (interfieren con las [place], [place] no requieren atencin mdica): Tdap  Dolor en el sitio de la inyeccin (alrededor de 1 de cada 20 adolescentes y 1 de cada 100 adultos).  Enrojecimiento o inflamacin de la inyeccin (alrededor de 1 de cada 16 adolescentes y 1 de cada 25 adultos).  Fiebre de ms de 102 F (38.9 C) (alrededor de 1 de cada 100 adolescentes y 1 de cada 250 adultos).  Dolor de cabeza (1 de cada 300).  Nuseas, vmitos, diarrea, o dolor de estmago (hasta 3 de cada 100 adolescentes y 1 de cada 100 adultos). Td  Fiebre de ms de 102 F (38.9 C) (poco comn). Tdap o Td  Inflamacin de gran extensin en el brazo en el que se aplic la vacuna (hasta 3 de cada 100). Problemas [place] (no puede realizar [employer]; requiere Psychologist, prison and probation services) Tdap o Td  Inflamacin, dolor intenso, sangrado y enrojecimiento en el brazo, en el sitio de la inyeccin (poco frecuente). Puede producirse una reaccin alrgica grave despus de cualquier vacuna. Se estima que estas reacciones ocurren en menos de una de cada un milln de dosis. QU PASA SI HAY UNA REACCIN GRAVE? Qu signos debo buscar? Cualquier estado poco habitual, como una reaccin alrgica grave o fiebre alta. Si le produce Runner, broadcasting/film/video grave, se manifestar dentro de algunos minutos a una hora despus de recibir la vacuna. Entre los signos de Automotive engineer grave se encuentran la dificultad para respirar, debilidad, ronquera o sibilancias, latidos cardacos acelerados, urticaria, mareos, palidez, o inflamacin de la garganta. Qu debo  hacer?  Comunquese con su mdico o lleve inmediatamente a la persona al mdico.  Dgale a su mdico qu ocurri, la fecha y hora en que sucedi y IT consultant aplicaron  la vacuna.  Pida a su mdico que informe sobre la reaccin llenando un formulario del Sistema de Informacin de Reacciones Adversos a las Administrator, arts (VAERS, por sus siglas en ingls). O, puede presentar este informe a travs del sitio web de VAERS [email or web address] o puede llamar al ([phone]. VAERS no brinda asistencia mdica. PROGRAMA NACIONAL DE COMPENSACIN DE DAOS POR VACUNAS El [place] de Compensacin de Daos por Vacunas (VICP) fue creado en 1986.  Aquellas personas que consideren que han sufrido un dao como consecuencia de una vacuna y quieren saber ms acerca del programa y como presentar Roslynn Amble, [place] llamar al [phone] o visitar su sitio web en [email or web address]  CMO Roxan Diesel MS INFORMACIN?  El profesional podr darle el prospecto de la vacuna o sugerirle otras fuentes de informacin.  Comunquese con el servicio de salud de su localidad o [address].  Comunquese con los Centros para el control y la prevencin de Child psychotherapist for Disease Control and Prevention , CDC).  Llame al [phone] (1-800-CDC-INFO).  Visite los sitios web de Energy Transfer Partners en [email or web address].uy CDC Td and Tdap Interim VIS-Spanish (06/15/10) Document Released: 08/25/2008 Document Revised: 08/01/2011 [facility] Patient Information 2013 [place], [place].                                                   Control del colesterol  Los niveles de colesterol en el organismo estn determinados significativamente por su dieta. Los niveles de colesterol tambin se relacionan con la enfermedad cardaca. El material que sigue ayuda a Software engineer relacin y a Chiropractor qu puede hacer para mantener su corazn sano. No todo el colesterol es [place]. Las lipoprotenas de baja densidad (LDL) forman  el colesterol "malo". El colesterol malo puede ocasionar depsitos de grasa que se acumulan en el interior de las arterias. Las lipoprotenas de alta densidad (HDL) es el colesterol "bueno". Ayuda a remover el colesterol LDL "malo" de la [place]. El colesterol es un factor de riesgo muy importante para la enfermedad cardaca. Otros factores de riesgo son la hipertensin arterial, el hbito de fumar, el estrs, la herencia y [place].   El msculo cardaco obtiene el suministro de sangre a travs de las arterias coronarias. Si su colesterol LDL ("malo") est elevado y el HDL ("bueno") es bajo, tiene un factor de riesgo para que se formen depsitos de Holiday representative en las arterias coronarias (los vasos sanguneos que suministran sangre al corazn). Esto hace que haya menos lugar para que la sangre circule. Sin la suficiente sangre y oxgeno, el msculo cardaco no puede funcionar correctamente, y usted podr sentir dolores en el pecho (angina pectoris). Cuando una arteria coronaria se cierra completamente, una parte del msculo cardaco puede morir (infarto de miocardio).  CONTROL DEL COLESTEROL Cuando el profesional que lo asiste enva la sangre al laboratorio para Artist nivel de colesterol, puede realizarle tambin un perfil completo de los lpidos. Con esta prueba, se puede determinar la cantidad total de colesterol, as como los niveles de LDL y HDL. Los triglicridos son un tipo de grasa que circula en la sangre y que tambin puede utilizarse para determinar el riesgo de enfermedad cardaca. En la siguiente tabla se establecen los nmeros ideales: Prueba: Colesterol total  Menos de 200 mg/dl.  Prueba: LDL "colesterol malo"  Menos de 100 mg/dl.   Menos de 70 mg/dl si tiene  riesgo muy elevado de sufrir un ataque cardaco o muerte cardaca sbita.  Prueba: HDL "colesterol bueno"  Mujeres: Ms de 50 mg/dl.   Hombres: Ms de 40 mg/dl.  Prueba: Trigliceridos  Menos de 150 mg/dl.    CONTROL DEL  COLESTEROL CON DIETA Aunque factores como el ejercicio y el estilo de vida son importantes, la "primera lnea de ataque" es la dieta. Esto se debe a que se sabe que ciertos alimentos hacen subir el colesterol y otros lo [place]. El objetivo debe ser ConAgra Foods alimentos, de modo que tengan un efecto sobre el colesterol y, an ms importante, Microbiologist las grasas saturadas y trans con otros tipos de grasas, como las monoinsaturadas y las poliinsaturadas y cidos grasos omega-3 . En promedio, una persona no debe consumir ms de 15 a 17 g de grasas saturadas por C.H. Robinson Worldwide. Las grasas saturadas y trans se consideran grasas "malas", ya que elevan el colesterol LDL. Las grasas saturadas se encuentran principalmente en productos animales como carne, [place] y crema. Pero esto no significa que usted Marketing executive todas sus comidas favoritas. Actualmente, como lo muestra el cuadro que figura al final de este documento, hay sustitutos de buen sabor, bajos en grasas y en colesterol, para la mayora de los alimentos que a usted Musician. Elija aquellos alimentos alternativos que sean bajos en grasas o sin grasas. Elija cortes de carne del cuarto trasero o lomo ya que estos cortes son los que tienen menor cantidad de grasa y Oncologist. El pollo (sin piel), el pescado, la carne de ternera, y la [place] molida son excelentes opciones. Elimine las carnes Tyson Foods o el salami. Los Federal[employer]og[employer] o nada de grasas saturadas. Cuando consuma carne [place], carne de aves de corral, o pescado, hgalo en porciones de 85 gramos (3 onzas). Las grasas trans tambin se llaman "aceites parcialmente hidrogenados". Son aceites manipulados cientficamente de [place] que son slidos a Publishing rights manager, tienen una larga vida y Glass blower/designer sabor y la textura de los alimentos a los que se Scientist, clinical (histocompatibility and immunogenetics). Las grasas trans se encuentran en la [place], [place], crackers y alimentos horneados.  Para hornear y cocinar, el  aceite es un excelente sustituto para la [place]. Los aceites monoinsaturados tienen un beneficio particular, ya que se cree que disminuyen el colesterol LDL (colesterol malo) y elevan el HDL. Deber evitar los aceites tropicales saturados como el de coco y el de [place].  Recuerde, adems, que puede comer sin restricciones los grupos de alimentos que son naturalmente libres de grasas saturadas y Neurosurgeon trans, entre los que se incluyen el pescado, las frutas (excepto el [place]), verduras, frijoles, cereales (cebada, arroz, [place], trigo) y las pastas (sin salsas con crema)   IDENTIFIQUE LOS ALIMENTOS QUE DISMINUYEN EL COLESTEROL  Pueden disminuir el colesterol las fibras solubles que estn en las frutas, como las [place], en los vegetales como el brcoli, las patatas y las zanahorias; en las legumbres como frijoles, guisantes y Therapist, occupational; y en los cereales como la cebada. Los alimentos fortificados con fitosteroles tambin Engineer, production. Debe consumir al menos 2 g de estos alimentos a diario para Financial planner de disminucin de [place].  En el supermercado, lea las etiquetas de los envases para identificar los alimentos bajos en grasas saturadas, libres de grasas trans y bajos en [place], . Elija quesos que tengan solo de 2 a 3 g de grasa saturada por onza (28,35 g). Use una margarina que no dae el corazn, [place] de  grasas trans o aceite parcialmente hidrogenado. Al comprar alimentos horneados (galletitas dulces y [place]) evite el aceite parcialmente hidrogenado. Los panes y bollos debern ser de granos enteros (harina de maz o de avena entera, en lugar de "harina" o "harina enriquecida"). Compre sopas en lata que no sean cremosas, con bajo contenido de sal y sin grasas adicionadas.   TCNICAS DE PREPARACIN DE LOS ALIMENTOS  Nunca fra los alimentos en aceite abundante. Si debe frer, hgalo en poco aceite y removiendo [place], porque as se utilizan muy pocas grasas, o  utilice un spray antiadherente. Cuando le sea posible, hierva, hornee o ase las carnes y cocine los vegetales al vapor. En vez de Aetna con mantequilla o [place], utilice limn y hierbas, pur de Psychologist, educational y canela (para las calabazas y batatas), yogurt y salsa descremados y aderezos para ensaladas bajos en contenido graso.   BAJO EN GRASAS SATURADAS / SUSTITUTOS BAJOS EN GRASA  Carnes / Grasas saturadas (g)  Evite: Bife, corte graso (3 oz/85 g) / 11 g   Elija: Bife, corte magro (3 oz/85 g) / 4 g   Evite: Hamburguesa (3 oz/85 g) / 7 g   Elija:  [place] magra (3 oz/85 g) / 5 g   Evite: Jamn (3 oz/85 g) / 6 g   Elija:  Jamn magro (3 oz/85 g) / 2.4 g   Evite: Pollo, con piel (3 oz/85 g), Carne oscura / 4 g   Elija:  Pollo, sin piel (3 oz/85 g), Carne oscura / 2 g   Evite: Pollo, con piel (3 oz/85 g), Carne magra / 2.5 g   Elija: Pollo, sin piel (3 oz/85 g), Carne magra / 1 g  Lcteos / Grasas saturadas (g)  Evite: Leche entera (1 taza) / 5 g   Elija: Leche con bajo contenido de grasa, 2% (1 taza) / 3 g   Elija: Leche con bajo contenido de grasa, 1% (1 taza) / 1.5 g   Elija: Leche descremada (1 taza) / 0.3 g   Evite: Queso duro (1 oz/28 g) / 6 g   Elija: Queso descremado (1 oz/28 g) / 2-3 g   Evite: Queso cottage, 4% grasa (1 taza)/ 6.5 g   Elija: Queso cottage con bajo contenido de grasa, 1% grasa (1 taza)/ 1.5 g   Evite: Helado (1 taza) / 9 g   Elija: Sorbete (1 taza) / 2.5 g   Elija: Yogurt helado sin contenido de grasa (1 taza) / 0.3 g   Elija: Barras de fruta congeladas / vestigios   Evite: Crema batida (1 cucharada) / 3.5 g   Elija: Batidos glac sin lcteos (1 cucharada) / 1 g  Condimentos / Grasas saturadas (g)  Evite: Mayonesa (1 cucharada) / 2 g   Elija: Mayonesa con bajo contenido de grasa (1 cucharada) / 1 g   Evite: Manteca (1 cucharada) / 7 g   Elija: Margarina extra light (1 cucharada) / 1 g   Evite: Aceite de coco (1  cucharada) / 11.8 g   Elija: Aceite de oliva (1 cucharada) / 1.8 g   Elija: Aceite de maz (1 cucharada) / 1.7 g   Elija: Aceite de crtamo (1 cucharada) / 1.2 g   Elija: Aceite de girasol (1 cucharada) / 1.4 g   Elija: Aceite de soja (1 cucharada) / 2.4 g   Elija: Aceite de canola (1 cucharada) / 1 g  Document Released: 05/09/2005 Document Revised: 01/19/2011 [facility] Patient Information 2012 [place], [place]. Exercise to Lose  Weight Exercise and a healthy diet may help you lose weight. Your doctor may suggest specific exercises. EXERCISE IDEAS AND TIPS  Choose low-cost things you enjoy doing, such as walking, bicycling, or exercising to workout videos.   Take stairs instead of the elevator.   Walk during your lunch break.   Park your car further away from work or school.   Go to a gym or an exercise class.   Start with 5 to 10 minutes of exercise each day. Build up to 30 minutes of exercise 4 to 6 days a week.   Wear shoes with good support and comfortable clothes.   Stretch before and after working out.   Work out until you breathe harder and your heart beats faster.   Drink extra water when you exercise.   Do not do so much that you hurt yourself, feel dizzy, or get very short of breath.  Exercises that burn about 150 calories:  Running 1  miles in 15 minutes.   Playing volleyball for 45 to 60 minutes.   Washing and waxing a car for 45 to 60 minutes.   Playing touch football for 45 minutes.   Walking 1  miles in 35 minutes.   Pushing a stroller 1  miles in 30 minutes.   Playing basketball for 30 minutes.   Raking leaves for 30 minutes.   Bicycling 5 miles in 30 minutes.   Walking 2 miles in 30 minutes.   Dancing for 30 minutes.   Shoveling snow for 15 minutes.   Swimming laps for 20 minutes.   Walking up stairs for 15 minutes.   Bicycling 4 miles in 15 minutes.   Gardening for 30 to 45 minutes.   Jumping rope for 15 minutes.   Washing  windows or floors for 45 to 60 minutes.  Document Released: 06/11/2010 Document Revised: 01/19/2011 Document Reviewed: 06/11/2010 [facility] Patient Information 2012 [place], [place].

## 2012-10-09 LAB — URINE CULTURE: Colony Count: 50000

## 2012-10-09 LAB — VITAMIN D 25 HYDROXY (VIT D DEFICIENCY, FRACTURES): Vit D, 25-Hydroxy: 37 ng/mL (ref 30–89)

## 2012-10-11 ENCOUNTER — Encounter: Payer: Self-pay | Admitting: Gynecology

## 2012-10-18 ENCOUNTER — Encounter: Payer: Self-pay | Admitting: Gynecology

## 2014-01-23 IMAGING — US US ABDOMEN LIMITED
1 series · 14 of 18 positions shown · non-contrast
Comparison: None.

CLINICAL DATA: Left upper quadrant abdominal pain and swelling.

LIMITED ABDOMINAL ULTRASOUND

[Series 1: us abdomen limited · 0.30mm/px · 14 of 18 slices shown]
[im 1/18]
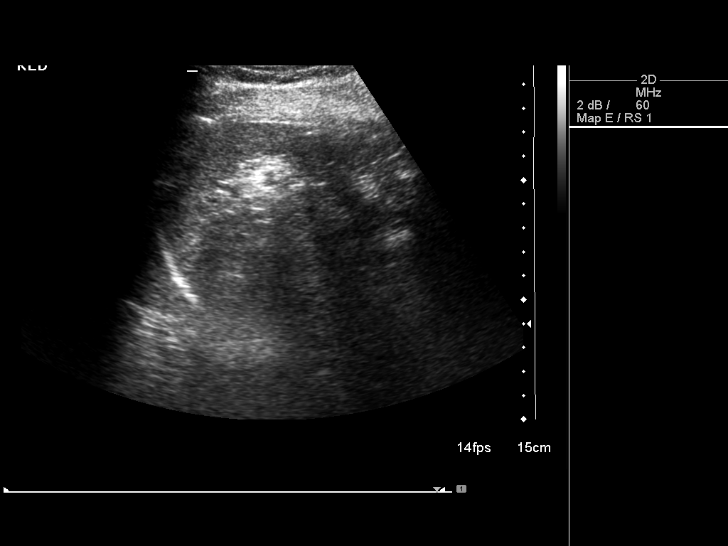
[im 2/18]
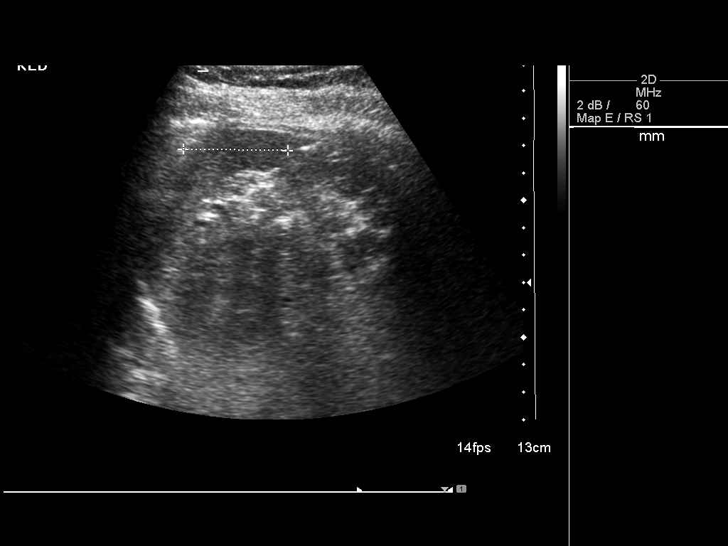
[im 4/18]
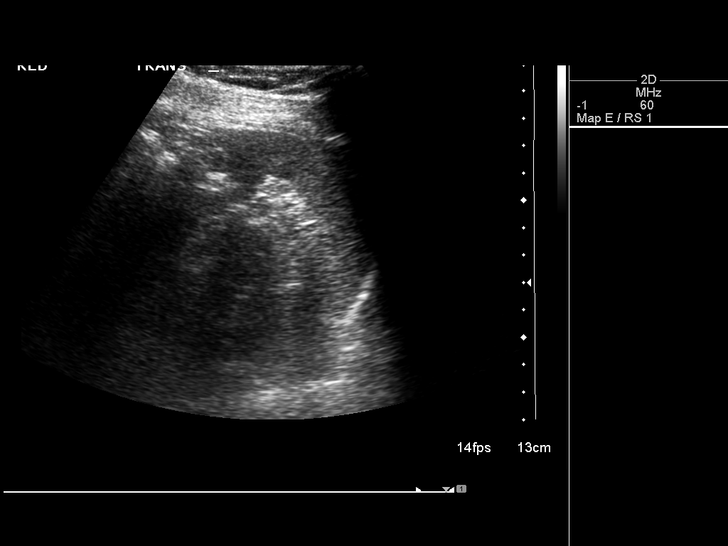
[im 5/18]
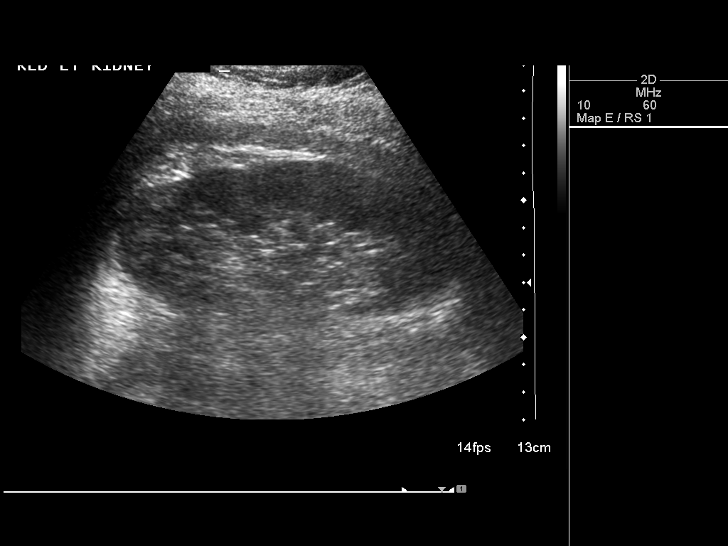
[im 6/18]
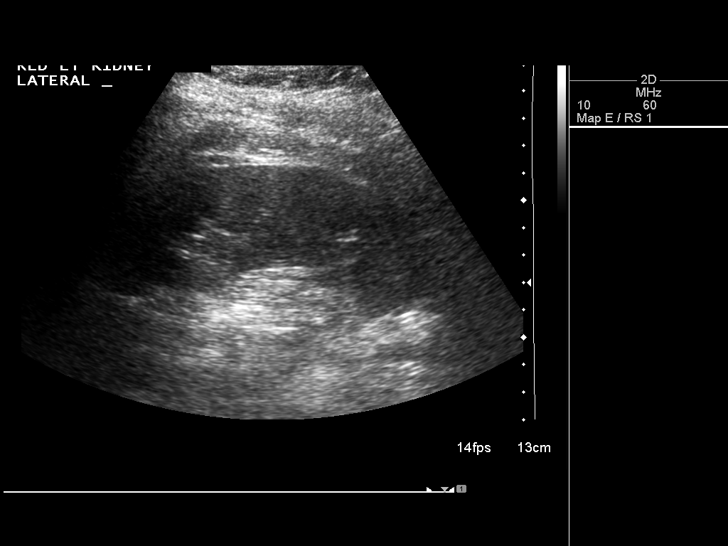
[im 8/18]
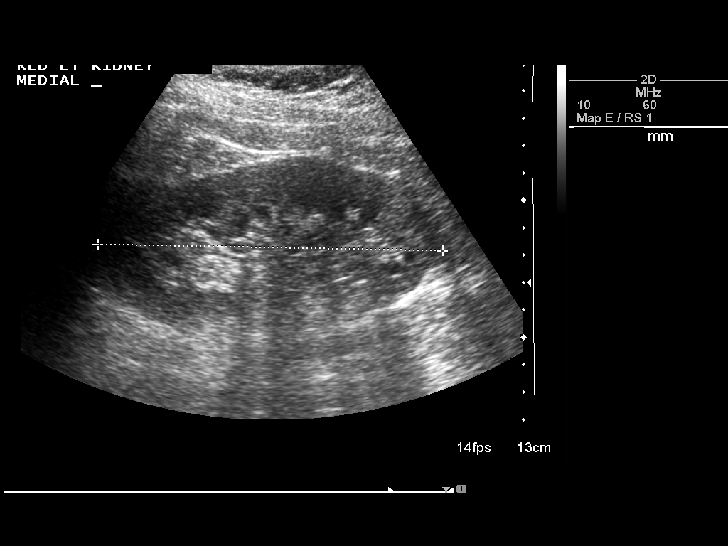
[im 9/18]
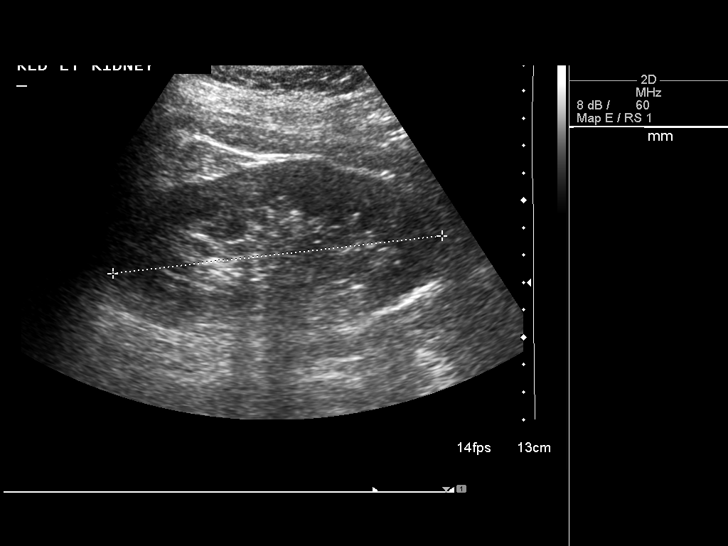
[im 10/18]
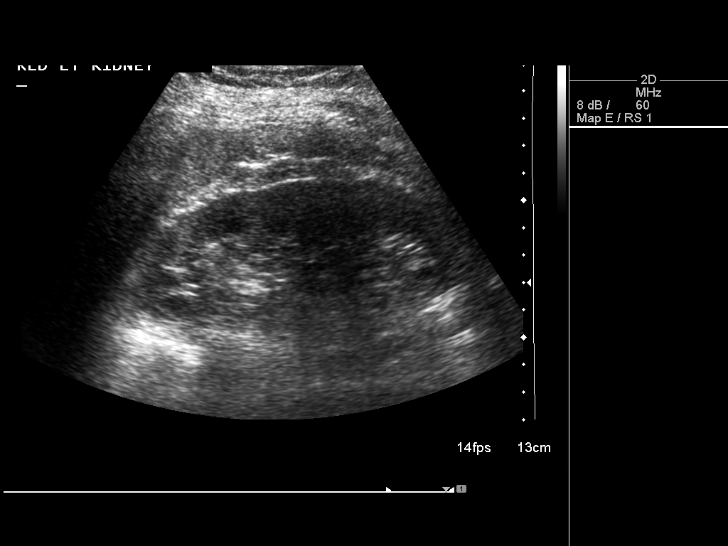
[im 11/18]
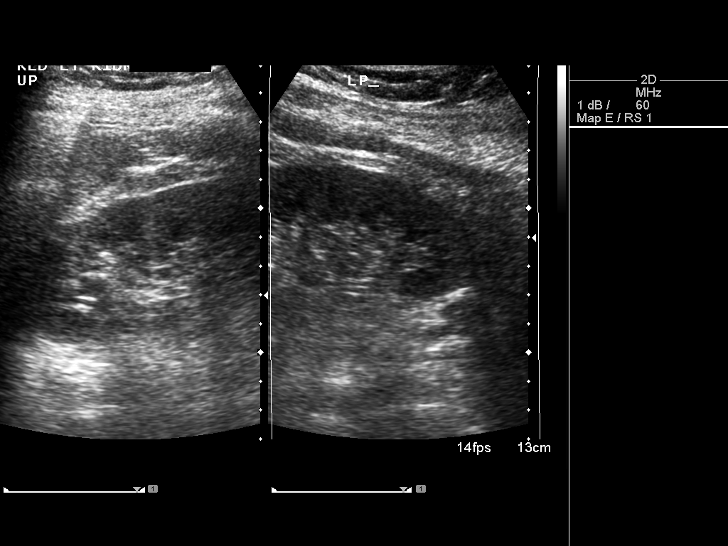
[im 13/18]
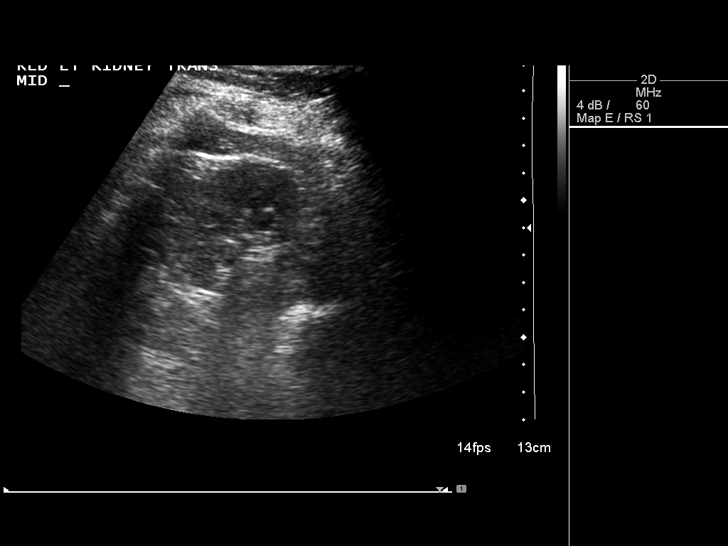
[im 14/18]
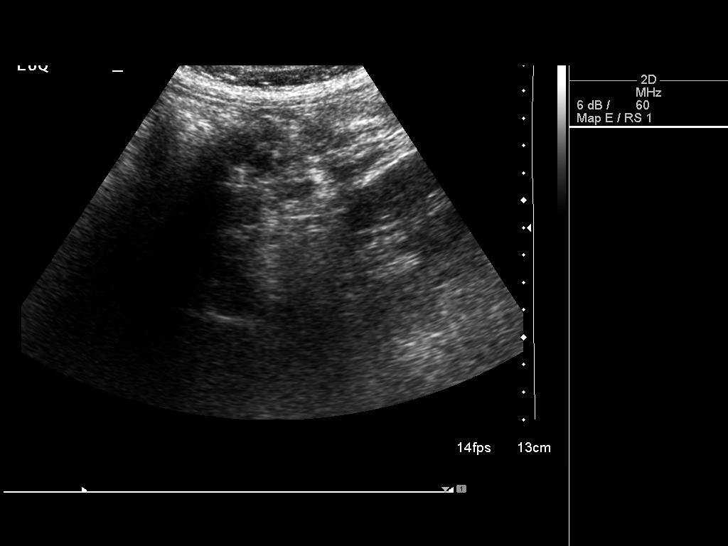
[im 15/18]
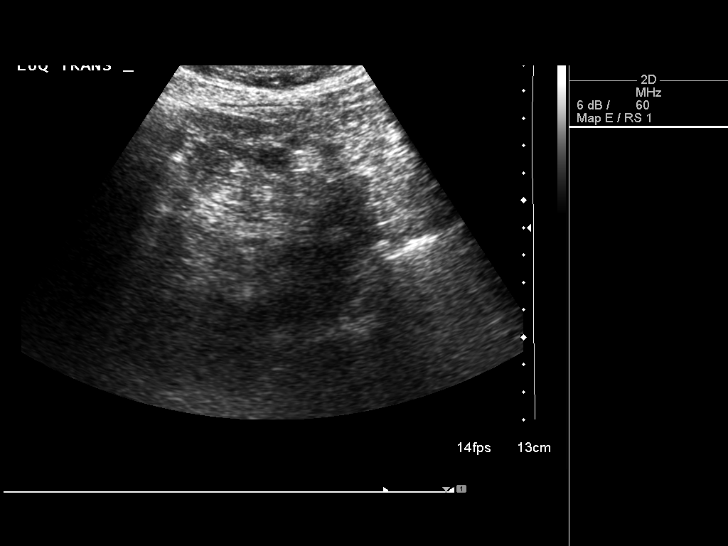
[im 17/18]
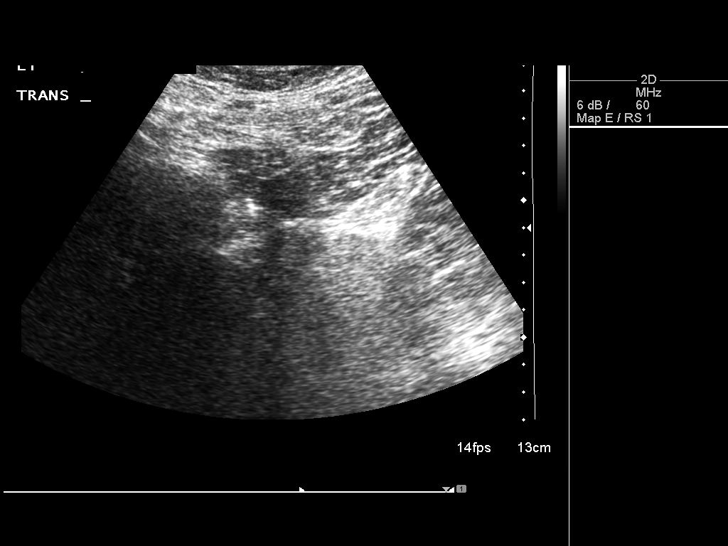
[im 18/18]
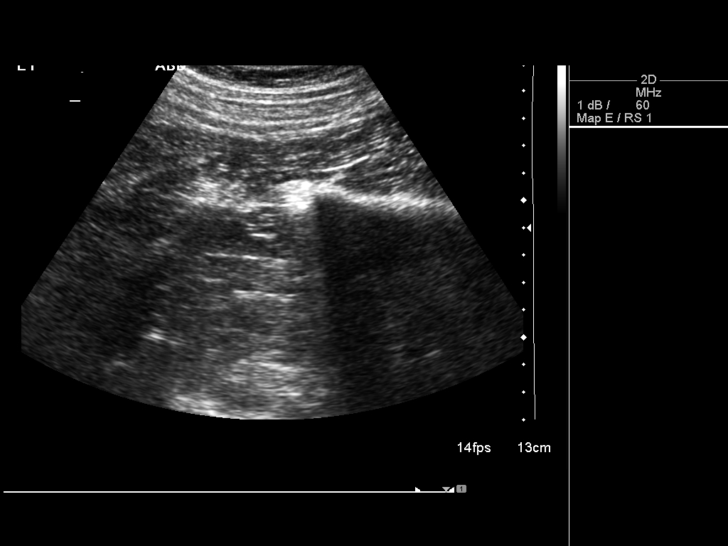

[14 of 18 positions shown; findings below may reference images not displayed]

FINDINGS: Spleen is normal in size and echotexture.

Left kidney measures 12.6 cm. No hydronephrosis.

No left upper quadrant mass identified. No ascites.
IMPRESSION: Normal appearance of the spleen and left kidney.  No explanation
for left-sided pain.

## 2014-02-05 ENCOUNTER — Other Ambulatory Visit (HOSPITAL_COMMUNITY)
Admission: RE | Admit: 2014-02-05 | Discharge: 2014-02-05 | Disposition: A | Discharge status: Home / Self Care | Payer: BC Managed Care – PPO | Source: Ambulatory Visit | Attending: Gynecology | Admitting: Gynecology

## 2014-02-05 ENCOUNTER — Other Ambulatory Visit: Payer: Self-pay | Admitting: Gynecology

## 2014-02-05 ENCOUNTER — Ambulatory Visit (INDEPENDENT_AMBULATORY_CARE_PROVIDER_SITE_OTHER): Payer: BC Managed Care – PPO

## 2014-02-05 ENCOUNTER — Encounter: Payer: Self-pay | Admitting: Gynecology

## 2014-02-05 ENCOUNTER — Ambulatory Visit (INDEPENDENT_AMBULATORY_CARE_PROVIDER_SITE_OTHER): Payer: BC Managed Care – PPO | Admitting: Gynecology

## 2014-02-05 VITALS — BP 120/82 | Ht 64.0 in | Wt 146.0 lb

## 2014-02-05 DIAGNOSIS — N920 Excessive and frequent menstruation with regular cycle: Secondary | ICD-10-CM

## 2014-02-05 DIAGNOSIS — N8300 Follicular cyst of ovary, unspecified side: Secondary | ICD-10-CM

## 2014-02-05 DIAGNOSIS — L659 Nonscarring hair loss, unspecified: Secondary | ICD-10-CM

## 2014-02-05 DIAGNOSIS — Z01419 Encounter for gynecological examination (general) (routine) without abnormal findings: Secondary | ICD-10-CM

## 2014-02-05 DIAGNOSIS — N949 Unspecified condition associated with female genital organs and menstrual cycle: Secondary | ICD-10-CM

## 2014-02-05 DIAGNOSIS — Z87410 Personal history of cervical dysplasia: Secondary | ICD-10-CM

## 2014-02-05 DIAGNOSIS — R634 Abnormal weight loss: Secondary | ICD-10-CM

## 2014-02-05 DIAGNOSIS — R102 Pelvic and perineal pain: Secondary | ICD-10-CM

## 2014-02-05 DIAGNOSIS — Z23 Encounter for immunization: Secondary | ICD-10-CM

## 2014-02-05 DIAGNOSIS — Z1151 Encounter for screening for human papillomavirus (HPV): Secondary | ICD-10-CM | POA: Diagnosis present

## 2014-02-05 DIAGNOSIS — Z8639 Personal history of other endocrine, nutritional and metabolic disease: Secondary | ICD-10-CM

## 2014-02-05 LAB — CBC WITH DIFFERENTIAL/PLATELET
Basophils Absolute: 0.1 10*3/uL (ref 0.0–0.1)
Basophils Relative: 1 % (ref 0–1)
Eosinophils Absolute: 0.6 10*3/uL (ref 0.0–0.7)
Eosinophils Relative: 9 % — ABNORMAL HIGH (ref 0–5)
HCT: 36 % (ref 36.0–46.0)
Hemoglobin: 12.3 g/dL (ref 12.0–15.0)
Lymphocytes Relative: 41 % (ref 12–46)
Lymphs Abs: 2.9 10*3/uL (ref 0.7–4.0)
MCH: 29.5 pg (ref 26.0–34.0)
MCHC: 34.2 g/dL (ref 30.0–36.0)
MCV: 86.3 fL (ref 78.0–100.0)
Monocytes Absolute: 0.4 10*3/uL (ref 0.1–1.0)
Monocytes Relative: 6 % (ref 3–12)
Neutro Abs: 3 10*3/uL (ref 1.7–7.7)
Neutrophils Relative %: 43 % (ref 43–77)
Platelets: 234 10*3/uL (ref 150–400)
RBC: 4.17 MIL/uL (ref 3.87–5.11)
RDW: 14.2 % (ref 11.5–15.5)
WBC: 7 10*3/uL (ref 4.0–10.5)

## 2014-02-05 LAB — LIPID PANEL
Cholesterol: 156 mg/dL (ref 0–200)
HDL: 61 mg/dL (ref >39)
LDL Cholesterol: 80 mg/dL (ref 0–99)
Total CHOL/HDL Ratio: 2.6 Ratio
Triglycerides: 73 mg/dL (ref <150)
VLDL: 15 mg/dL (ref 0–40)

## 2014-02-05 LAB — COMPREHENSIVE METABOLIC PANEL
ALT: 35 U/L (ref 0–35)
AST: 29 U/L (ref 0–37)
Albumin: 4.3 g/dL (ref 3.5–5.2)
Alkaline Phosphatase: 82 U/L (ref 39–117)
BUN: 9 mg/dL (ref 6–23)
CO2: 21 mEq/L (ref 19–32)
Calcium: 9.2 mg/dL (ref 8.4–10.5)
Chloride: 105 mEq/L (ref 96–112)
Creat: 0.62 mg/dL (ref 0.50–1.10)
Glucose, Bld: 95 mg/dL (ref 70–99)
Potassium: 3.8 mEq/L (ref 3.5–5.3)
Sodium: 139 mEq/L (ref 135–145)
Total Bilirubin: 0.6 mg/dL (ref 0.2–1.2)
Total Protein: 7.3 g/dL (ref 6.0–8.3)

## 2014-02-05 LAB — TSH: TSH: 1.398 u[IU]/mL (ref 0.350–4.500)

## 2014-02-05 MED ORDER — TRANEXAMIC ACID 650 MG PO TABS: ORAL_TABLET | ORAL | Status: DC

## 2014-02-05 NOTE — Patient Instructions (Signed)
Influenza Virus Vaccine injection (Fluarix) Qu es este medicamento? La VACUNA ANTIGRIPAL ayuda a disminuir el riesgo de contraer la influenza, tambin conocida como la gripe. La vacuna solo ayuda a protegerle contra algunas cepas de influenza. Esta vacuna no ayuda a reducir Catering manager de contraer influenza pandmica H1N1. Este medicamento puede ser utilizado para otros usos; si tiene alguna pregunta consulte con su proveedor de atencin mdica o con su farmacutico. MARCAS COMERCIALES DISPONIBLES: Fluarix, Fluzone Qu le debo informar a mi profesional de la salud antes de tomar este medicamento? Necesita saber si usted presenta alguno de los siguientes problemas o situaciones: -trastorno de sangrado como hemofilia -fiebre o infeccin -sndrome de Guillain-Barre u otros problemas neurolgicos -problemas del sistema inmunolgico -infeccin por el virus de la inmunodeficiencia humana (VIH) o SIDA -niveles bajos de plaquetas en la sangre -esclerosis mltiple -una Risk analyst o inusual a las vacunas antigripales, a los huevos, protenas de pollo, al ltex, a la gentamicina, a otros medicamentos, alimentos, colorantes o conservantes -si est embarazada o buscando quedar embarazada -si est amamantando a un beb Cmo debo utilizar este medicamento? Esta vacuna se administra mediante inyeccin por va intramuscular. Lo administra un profesional de [employer]	[place]. Recibir una copia de informacin escrita sobre la vacuna antes de cada vacuna. Asegrese de leer este folleto cada vez cuidadosamente. Este folleto puede cambiar con frecuencia. Hable con su pediatra para informarse acerca del uso de este medicamento en nios. Puede requerir atencin especial. Sobredosis: Pngase en contacto inmediatamente con un centro toxicolgico o una sala de urgencia si usted cree que haya tomado demasiado medicamento. ATENCIN: [employer] es solo para usted. No comparta este medicamento con nadie. Qu sucede  si me olvido de una dosis? No se aplica en este caso. Qu puede interactuar con este medicamento? -quimioterapia o radioterapia -medicamentos que suprimen el sistema inmunolgico, tales como etanercept, anakinra, infliximab y adalimumab -medicamentos que tratan o previenen cogulos sanguneos, como warfarina -fenitona -medicamentos esteroideos, como la prednisona o la cortisona -teofilina -vacunas Puede ser que esta lista no menciona todas las posibles interacciones. Informe a su profesional de KB Home	[place] de [employer] productos a base de hierbas, medicamentos de [place] o suplementos nutritivos que est tomando. Si usted fuma, consume bebidas alcohlicas o si utiliza drogas ilegales, indqueselo tambin a su profesional de [employer] Home	[place]. Algunas sustancias pueden interactuar con su medicamento. A qu debo estar atento al usar [employer]? Informe a su mdico o a Barrister's clerk de la [employer] todos los efectos secundarios que persistan despus de 3 das. Llame a su proveedor de atencin mdica si se presentan sntomas inusuales dentro de las 6 semanas posteriores a la vacunacin. Es posible que todava pueda contraer la gripe, pero la enfermedad no ser tan fuerte como normalmente. No puede contraer la gripe de esta vacuna. La vacuna antigripal no le protege contra resfros u otras enfermedades que pueden causar [place]. Debe vacunarse cada ao. Qu efectos secundarios puedo tener al [employer] este medicamento? Efectos secundarios que debe informar a su mdico o a Barrister's clerk de la salud tan pronto como sea posible: -reacciones alrgicas como erupcin cutnea, picazn o urticarias, hinchazn de la cara, labios o lengua Efectos secundarios que, por lo general, no requieren atencin mdica (debe informarlos a su mdico o a su profesional de la salud si persisten o si son molestos): -fiebre -dolor de cabeza -molestias y dolores musculares -dolor, sensibilidad, enrojecimiento o Database administrator de la inyeccin -cansancio o debilidad Puede ser que [employer]  no menciona todos los posibles efectos secundarios. Comunquese a su mdico por asesoramiento mdico [employer]. Usted puede informar los efectos secundarios a la FDA por telfono al 1-800-FDA-1088. Dnde debo guardar mi medicina? Esta vacuna se administra solamente en clnicas, farmacias, consultorio mdico u otro consultorio de un profesional de la salud y no Sports coach en su domicilio. ATENCIN: Este folleto es un resumen. Puede ser que no cubra toda la posible informacin. Si usted tiene preguntas acerca de esta medicina, consulte con su mdico, su farmacutico o su profesional de Technical sales engineer.  2015, Elsevier/Gold Standard. (2009-11-10 15:31:40) Romero Belling de trompas laparoscpica (Laparoscopic Tubal Ligation) La ligadura laparoscpica de las trompas es un procedimiento que cierra las trompas de [place], sin relacin con el momento del [place]. Al cerrar las trompas de [place], los vulos que se liberan de los ovarios no pueden entrar en el tero y los espermatozoides no pueden llegar al vulo. Tambin se la conoce como "[place] de esterilizacin femenina". La ligadura de trompas significa que no podr quedar embarazada o tener un beb.   Aunque en algunos casos pueda revertirse, debera [employer] e irreversible. Si desea quedar embarazada en el futuro, no debe realizarse este procedimiento.  INFORME A SU MDICO SOBRE:   Alergias a alimentos o medicamentos.  Medicamentos que [place], incluyendo vitaminas, hierbas, gotas oftlmicas, medicamentos de venta libre y cremas.  Uso de corticoides (por va oral o cremas).  Problemas anteriores debido a anestsicos.  Antecedentes de hemorragias o cogulos sanguneos.  Resfros o infecciones recientes.  Cirugas anteriores.  Otros problemas de salud, incluyendo diabetes y problemas renales.  Posibilidad de embarazo, si  corresponde.  Embarazos anteriores. [place].  Sangrado.  Lesin en los rganos circundantes.  Efectos secundarios de Architect.  Fallas en el procedimiento.  Glennis Brink ectpico  Arrepentimiento por haber realizado el procedimiento ANTES DEL PROCEDIMIENTO   No tome aspirina ni anticoagulantes durante la semana previa al procedimiento, o segn le hayan indicado. Puede ocasionar hemorragias.  No debe comer ni beber nada durante las 6 a 8 horas previas al procedimiento. PROCEDIMIENTO   Antes del procedimiento le darn un medicamento para que pueda relajarse (sedante). Durante el procedimiento le administrarn un medicamento que la har dormir (anestesia general).  Le colocarn un tubo por la garganta para ayudarla a respirar mientras est bajo los efectos de la anestesia general.  Se realizan dos pequeos cortes (incisiones) en la zona inferior del abdomen, cerca del ombligo.  El rea abdominal se infla con un gas seguro (dixido de carbono) Esto ayuda al cirujano a operar, visualizar y a Naval architect otros rganos.  Un tubo delgado con una fuente de luz (laparoscopio) y Ardelia Mems cmara adjunta se inserta en el abdomen a travs de una de las incisiones cerca del ombligo. Tambin se insertan otros instrumentos pequeos a travs de la otra incisin abdominal.  Cuando se encuentran las trompas de [place] se obstruyen con un anillo, un clip o se queman (cauterizacin).  Despus se libera el gas del abdomen.  Se cierran las incisiones con grapas (suturas) y se coloca un vendaje sobre las incisiones. DESPUS DEL PROCEDIMIENTO   Deber hacer reposo en la sala de recuperacin hasta que se encuentre estable y se sienta bien.  Tendr algunas molestias abdominales leves durante 3 a 7 das. Le darn un medicamento para calmar las molestias.  Luego de esto, si no aparecen complicaciones, podr regresar a su casa. Pdale a alguna persona que la lleve a  su casa  y que permanezca con usted al menos durante 24 horas.  Podr sentir Social worker. Es consecuencia de Programmer, systems del tubo mientras se encontraba dormida.  Podr sentir Dispensing optician en la zona de los hombros porque queda un poco de aire atrapado entre el hgado y [place]. Esta sensacin es normal y desaparecer lentamente por s misma. Document Released: 05/09/2005 Document Revised: 11/08/2011 [facility] Patient Information 2015 [place], [place]. This information is not intended to replace advice given to you by your health care provider. Make sure you discuss any questions you have with your health care provider.

## 2014-02-05 NOTE — Progress Notes (Signed)
Stephanie Mcdonald [birth year] [id number]   History:    33 y.o.  for annual gyn exam who has been complaining of on and off left lower quadrant discomfort and at times dyspareunia. Patient had a ParaGard T380A IUD placed in 2014.Patient had a Mirena IUD prior to that but was changed because her liver function tests were found to be elevated. On followup 3 months after the Mirena IUD was removed her SGOT and SGPT were back to normal. Patient had a right breast fibroadenoma on biopsy in 2008. Patient does not examine her breasts on a regular basis. 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 Subsequent Pap smears have been normal. Patient completed Gardasil Vaccine series in 2008. Patient received a Tdap vaccine in 2014. Patient interested in the flu vaccine today. Patient has past history vitamin D deficiency. She's currently on calcium and vitamin D daily. Patient reports normal menstrual cycles. She is complains of losing hair from her scalp but hair growth on her periareolar regions. Patient also stated that she has lost weight. Review record indicated she was weighing 148 last year and currently down to 142 pounds.   Past medical history,surgical history, family history and social history were all reviewed and documented in the EPIC chart.  Gynecologic History Patient's last menstrual period was 01/17/2014. Contraception: IUD Last Pap: 2012. Results were: normal Last mammogram: Not indicated. Results were: Not indicated  Obstetric History OB History  Gravida Para Term Preterm AB SAB TAB Ectopic Multiple Living  2 2 2       2     # Outcome Date GA Lbr Len/2nd Weight Sex Delivery Anes PTL Lv  2 TRM     F SVD  N Y  1 TRM     F SVD  N Y       ROS: A ROS was performed and pertinent positives and negatives are included in the history.  GENERAL: No fevers or chills. HEENT: No change in vision, no earache, sore throat or sinus congestion.  NECK: No pain or stiffness. CARDIOVASCULAR: No chest pain or pressure. No palpitations. PULMONARY: No shortness of breath, cough or wheeze. GASTROINTESTINAL: No abdominal pain, nausea, vomiting or diarrhea, melena or bright red blood per rectum. GENITOURINARY: No urinary frequency, urgency, hesitancy or dysuria. MUSCULOSKELETAL: No joint or muscle pain, no back pain, no recent trauma. DERMATOLOGIC: No rash, no itching, no lesions. ENDOCRINE: No polyuria, polydipsia, no heat or cold intolerance. No recent change in weight. HEMATOLOGICAL: No anemia or easy bruising or bleeding. NEUROLOGIC: No headache, seizures, numbness, tingling or weakness. PSYCHIATRIC: No depression, no loss of interest in normal activity or change in sleep pattern.     Exam: chaperone present  BP 120/82  Ht 5\' 4"  (1.626 m)  Wt 146 lb (66.225 kg)  BMI 25.05 kg/m2  LMP 01/17/2014  Body mass index is 25.05 kg/(m^2).  General appearance : Well developed well nourished female. No acute distress HEENT: Neck supple, trachea midline, no carotid bruits, no thyroidmegaly Lungs: Clear to auscultation, no rhonchi or wheezes, or rib retractions  Heart: Regular rate and rhythm, no murmurs or gallops Breast:Examined in sitting and supine position were symmetrical in appearance, no palpable masses or tenderness,  no skin retraction, no nipple inversion, no nipple discharge, no skin discoloration, no axillary or supraclavicular lymphadenopathy Abdomen: no palpable masses or tenderness, no rebound or guarding Extremities: no edema or skin discoloration or tenderness  Pelvic:  Bartholin, Urethra, Skene  Glands: Within normal limits             Vagina: No gross lesions or discharge  Cervix: No gross lesions or discharge, IUD string seen  Uterus  anteverted, normal size, shape and consistency, non-tender and mobile  Adnexa  Without masses or tenderness  Anus and perineum  normal   Rectovaginal  normal sphincter tone without palpated masses  or tenderness             Hemoccult not indicated   Ultrasound today: Uterus measured 10.0 x 5.1 x 4.0 cm with endometrial stripe 5.2 mm. IUD was found in the normal position. Right ovary was normal. Left ovary with dominant follicle measuring 18 x 40 mm otherwise unremarkable ultrasound.  Assessment/Plan:  33 y.o. female for annual exam who is considering in the near future proceeding with a laparoscopic tubal ligation and removal of her ParaGard T380A IUD. Literature information was provided in [place]. Pap smear was done today. The following labs were ordered: Vitamin D level, comprehensive metabolic panel, fasting lipid profile, TSH, CBC, total testosterone, and urinalysis. Patient did receive the flu vaccine today. Patient informed as after the ultrasound that her last menstrual cycle lasted 14 days. We'll going to prescribe her Lysteda  650 mg 1 by mouth 3 times a day for 5 days to help with her heavy cycles.  Note: This dictation was prepared with  Dragon/digital dictation along withSmart phrase technology. Any transcriptional errors that result from this process are unintentional.   Terrance Mass MD, 11:21 AM 02/05/2014

## 2014-02-06 LAB — URINALYSIS W MICROSCOPIC + REFLEX CULTURE
Bacteria, UA: NONE SEEN
Bilirubin Urine: NEGATIVE
Casts: NONE SEEN
Crystals: NONE SEEN
Glucose, UA: NEGATIVE mg/dL
Hgb urine dipstick: NEGATIVE
Ketones, ur: NEGATIVE mg/dL
Leukocytes, UA: NEGATIVE
Nitrite: NEGATIVE
Protein, ur: NEGATIVE mg/dL
Specific Gravity, Urine: 1.016 (ref 1.005–1.030)
Squamous Epithelial / LPF: NONE SEEN
Urobilinogen, UA: 0.2 mg/dL (ref 0.0–1.0)
pH: 7.5 (ref 5.0–8.0)

## 2014-02-06 LAB — TESTOSTERONE: Testosterone: 44 ng/dL (ref 10–70)

## 2014-02-06 LAB — VITAMIN D 25 HYDROXY (VIT D DEFICIENCY, FRACTURES): Vit D, 25-Hydroxy: 42 ng/mL (ref 30–89)

## 2014-02-07 LAB — CYTOLOGY - PAP

## 2014-03-24 ENCOUNTER — Encounter: Payer: Self-pay | Admitting: Gynecology

## 2015-08-03 ENCOUNTER — Encounter: Payer: Self-pay | Admitting: Gynecology

## 2015-09-17 ENCOUNTER — Ambulatory Visit (INDEPENDENT_AMBULATORY_CARE_PROVIDER_SITE_OTHER): Payer: BLUE CROSS/BLUE SHIELD | Admitting: Gynecology

## 2015-09-17 ENCOUNTER — Encounter: Payer: Self-pay | Admitting: Gynecology

## 2015-09-17 VITALS — BP 124/76 | Ht 64.0 in | Wt 158.0 lb

## 2015-09-17 DIAGNOSIS — K219 Gastro-esophageal reflux disease without esophagitis: Secondary | ICD-10-CM

## 2015-09-17 DIAGNOSIS — Z01419 Encounter for gynecological examination (general) (routine) without abnormal findings: Secondary | ICD-10-CM

## 2015-09-17 DIAGNOSIS — N940 Mittelschmerz: Secondary | ICD-10-CM | POA: Diagnosis not present

## 2015-09-17 DIAGNOSIS — Z8639 Personal history of other endocrine, nutritional and metabolic disease: Secondary | ICD-10-CM | POA: Diagnosis not present

## 2015-09-17 LAB — CBC WITH DIFFERENTIAL/PLATELET
Basophils Absolute: 77 cells/uL (ref 0–200)
Basophils Relative: 1 %
Eosinophils Absolute: 539 cells/uL — ABNORMAL HIGH (ref 15–500)
Eosinophils Relative: 7 %
HCT: 35 % (ref 35.0–45.0)
Hemoglobin: 11.2 g/dL — ABNORMAL LOW (ref 11.7–15.5)
Lymphocytes Relative: 32 %
Lymphs Abs: 2464 cells/uL (ref 850–3900)
MCH: 25.5 pg — ABNORMAL LOW (ref 27.0–33.0)
MCHC: 32 g/dL (ref 32.0–36.0)
MCV: 79.7 fL — ABNORMAL LOW (ref 80.0–100.0)
MPV: 10.4 fL (ref 7.5–12.5)
Monocytes Absolute: 539 cells/uL (ref 200–950)
Monocytes Relative: 7 %
Neutro Abs: 4081 cells/uL (ref 1500–7800)
Neutrophils Relative %: 53 %
Platelets: 247 10*3/uL (ref 140–400)
RBC: 4.39 MIL/uL (ref 3.80–5.10)
RDW: 15.4 % — ABNORMAL HIGH (ref 11.0–15.0)
WBC: 7.7 10*3/uL (ref 3.8–10.8)

## 2015-09-17 LAB — COMPREHENSIVE METABOLIC PANEL
ALT: 19 U/L (ref 6–29)
AST: 17 U/L (ref 10–30)
Albumin: 4.3 g/dL (ref 3.6–5.1)
Alkaline Phosphatase: 89 U/L (ref 33–115)
BUN: 11 mg/dL (ref 7–25)
CO2: 23 mmol/L (ref 20–31)
Calcium: 8.8 mg/dL (ref 8.6–10.2)
Chloride: 103 mmol/L (ref 98–110)
Creat: 0.69 mg/dL (ref 0.50–1.10)
Glucose, Bld: 91 mg/dL (ref 65–99)
Potassium: 3.9 mmol/L (ref 3.5–5.3)
Sodium: 137 mmol/L (ref 135–146)
Total Bilirubin: 0.6 mg/dL (ref 0.2–1.2)
Total Protein: 7.2 g/dL (ref 6.1–8.1)

## 2015-09-17 LAB — LIPID PANEL
Cholesterol: 171 mg/dL (ref 125–200)
HDL: 69 mg/dL (ref >=46)
LDL Cholesterol: 90 mg/dL (ref <130)
Total CHOL/HDL Ratio: 2.5 Ratio (ref <=5.0)
Triglycerides: 58 mg/dL (ref <150)
VLDL: 12 mg/dL (ref <30)

## 2015-09-17 LAB — TSH: TSH: 1.79 mIU/L

## 2015-09-17 MED ORDER — DEXLANSOPRAZOLE 30 MG PO CPDR
30.0000 mg | DELAYED_RELEASE_CAPSULE | Freq: Every day | ORAL | Status: DC
Start: 2015-09-17 — End: 2016-09-19

## 2015-09-17 MED ORDER — CEPHALEXIN 500 MG PO CAPS: 500.0000 mg | ORAL_CAPSULE | Freq: Two times a day (BID) | ORAL | Status: DC

## 2015-09-17 NOTE — Patient Instructions (Signed)
Opciones de alimentos para pacientes con reflujo gastroesofágico - Adultos  (Food Choices for Gastroesophageal Reflux Disease, Adult)  Cuando se tiene reflujo gastroesofágico (ERGE), los alimentos que se ingieren y los hábitos de alimentación son muy importantes. Elegir los alimentos adecuados puede ayudar a aliviar las molestias ocasionadas por el ERGE.  ¿QUÉ PAUTAS GENERALES DEBO SEGUIR?  · Elija las frutas, los vegetales, los cereales integrales, los productos lácteos, la carne de vaca, de pescado y de ave con bajo contenido de grasas.  · Limite las grasas, como los aceites, los aderezos para ensalada, la manteca, los frutos secos y el aguacate.  · Lleve un registro de las comidas para identificar los alimentos que ocasionan síntomas.  · Evite los alimentos que le ocasionen reflujo. Pueden ser distintos para cada persona.  · Haga comidas pequeñas con frecuencia en lugar de tres comidas abundantes todos los días.  · Coma lentamente, en un clima distendido.  · Limite el consumo de alimentos fritos.  · Cocine los alimentos utilizando métodos que no sean la fritura.  · Evite el consumo alcohol.  · Evite beber grandes cantidades de líquidos con las comidas.  · Evite agacharse o recostarse hasta después de 2 o 3 horas de haber comido.  ¿QUÉ ALIMENTOS NO SE RECOMIENDAN?  Los siguientes son algunos alimentos y bebidas que pueden empeorar los síntomas:  Vegetales  Tomates. Jugo de tomate. Salsa de tomate y espagueti. Ajíes. Cebolla y ajo. Rábano picante.  Frutas  Naranjas, pomelos y limón (fruta y jugo).  Carnes  Carnes de vaca, de pescado y de ave con gran contenido de grasas. Esto incluye los perros calientes, las costillas, el jamón, la salchicha, el salame y el tocino.  Lácteos  Leche entera y leche chocolatada. Crema ácida. Crema. Mantequilla. Helados. Queso crema.   Bebidas  Café y té negro, con o sin cafeína Bebidas gaseosas o energizantes.  Condimentos  Salsa picante. Salsa barbacoa.   Dulces/postres  Chocolate y  cacao. Rosquillas. Menta y mentol.  Grasas y aceites  Alimentos con alto contenido de grasas, incluidas las papas fritas.  Otros  Vinagre. Especias picantes, como la pimienta negra, la pimienta blanca, la pimienta roja, la pimienta de cayena, el curry en polvo, los clavos de olor, el jengibre y el chile en polvo.  Los artículos mencionados arriba pueden no ser una lista completa de las bebidas y los alimentos que se deben evitar. Comuníquese con el nutricionista para recibir más información.     Esta información no tiene como fin reemplazar el consejo del médico. Asegúrese de hacerle al médico cualquier pregunta que tenga.     Document Released: 02/16/2005 Document Revised: 05/30/2014  Elsevier Interactive Patient Education ©2016 Elsevier Inc.

## 2015-09-17 NOTE — Progress Notes (Signed)
Stephanie Mcdonald 1980-07-02 [id number]   History:    35 y.o.  for annual gyn exam with complaints of at times midcycle she feels some pulling and Tigan sensation on her lower abdomen sometimes on her right sometimes on her left. Patient also has complained at times of reflux and is seeing a gastroenterologist 2 years ago and her primary care physician has had her on 2 different types of H2 antagonist.Patient had a ParaGard T380A IUD placed in 2014.Patient had a Mirena IUD prior to that but was changed because her liver function tests were found to be elevated. On followup 3 months after the Mirena IUD was removed her SGOT and SGPT were back to normal. Patient had a right breast fibroadenoma on biopsy in 2008. Patient does not examine her breasts on a regular basis. 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 Subsequent Pap smears have been normal. Patient completed Gardasil Vaccine series in 2008. Patient received a Tdap vaccine in 2014.Patient has past history vitamin D deficiency. She's currently on calcium and vitamin D daily. Patient reports normal menstrual cycles.   Patient also brought to my attention that she noted some tenderness underneath her left and right earlobes greater on her left. No discharge reported from her ear. She denies any fever or chills or any nausea or vomiting.  Past medical history,surgical history, family history and social history were all reviewed and documented in the EPIC chart.  Gynecologic History Patient's last menstrual period was 09/01/2015. Contraception: IUD Last Pap: 2012 and 2015. Results were: normal Last mammogram: Not indicated. Results were: Not indicated  Obstetric History OB History  Gravida Para Term Preterm AB SAB TAB Ectopic Multiple Living  2 2 2       2     # Outcome Date GA Lbr Len/2nd Weight Sex Delivery Anes PTL Lv  2 Term     F Vag-Spont  N Y  1 Term     F Vag-Spont  N Y        ROS: A ROS was performed and pertinent positives and negatives are included in the history.  GENERAL: No fevers or chills. HEENT: No change in vision, no earache, sore throat or sinus congestion. NECK: No pain or stiffness. CARDIOVASCULAR: No chest pain or pressure. No palpitations. PULMONARY: No shortness of breath, cough or wheeze. GASTROINTESTINAL: No abdominal pain, nausea, vomiting or diarrhea, melena or bright red blood per rectum. GENITOURINARY: No urinary frequency, urgency, hesitancy or dysuria. MUSCULOSKELETAL: No joint or muscle pain, no back pain, no recent trauma. DERMATOLOGIC: No rash, no itching, no lesions. ENDOCRINE: No polyuria, polydipsia, no heat or cold intolerance. No recent change in weight. HEMATOLOGICAL: No anemia or easy bruising or bleeding. NEUROLOGIC: No headache, seizures, numbness, tingling or weakness. PSYCHIATRIC: No depression, no loss of interest in normal activity or change in sleep pattern.     Exam: chaperone present  BP 124/76 mmHg  Ht 5\' 4"  (1.626 m)  Wt 158 lb (71.668 kg)  BMI 27.11 kg/m2  LMP 09/01/2015  Body mass index is 27.11 kg/(m^2).  General appearance : Well developed well nourished female. No acute distress HEENTLeft earlobe with an epidermal inclusion cyst slightly erythematous and mildly tender Eyes: no retinal hemorrhage or exudates,  Neck supple, trachea midline, no carotid bruits, no thyroidmegaly Lungs: Clear to auscultation, no rhonchi or wheezes, or rib retractions  Heart: Regular rate and rhythm, no murmurs or gallops Breast:Examined in sitting and supine  position were symmetrical in appearance, no palpable masses or tenderness,  no skin retraction, no nipple inversion, no nipple discharge, no skin discoloration, no axillary or supraclavicular lymphadenopathy Abdomen: no palpable masses or tenderness, no rebound or guarding Extremities: no edema or skin discoloration or tenderness  Pelvic:  Bartholin, Urethra, Skene Glands:  Within normal limits             Vagina: No gross lesions or discharge  Cervix: No gross lesions or discharge, IUD string visualized  Uterus  anteverted, normal size, shape and consistency, non-tender and mobile  Adnexa  Without masses or tenderness  Anus and perineum  normal   Rectovaginal  normal sphincter tone without palpated masses or tenderness             Hemoccult not indicated     Assessment/Plan:  35 y.o. female for annual exam with a left earlobe mild infection (inflamed epidermal inclusion cyst) be prescribed Keflex 500 mg to take 1 by mouth twice a day for 7 days. She was given a prescription for Dexlansoprazole  30 mg daily for her reflux. If she does not have improvement I've encouraged her to follow-up with her gastroenterologist who she saw 2 years ago in may need an endoscopy to rule out peptic ulcer disease. The following screening blood work were ordered today: Fasting lipid profile, comprehensive metabolic panel, TSH, CBC, and urinalysis. Because of her past history vitamin D deficiency vitamin D level will be drawn today as well. She was reassured that her lower abdominal discomfort midcycle is ovulatory (mittelschmerz) she didn't take over-the-counter ibuprofen when necessary.    Terrance Mass MD, 2:22 PM 09/17/2015

## 2015-09-18 ENCOUNTER — Other Ambulatory Visit: Payer: Self-pay | Admitting: Gynecology

## 2015-09-18 ENCOUNTER — Encounter: Payer: Self-pay | Admitting: Gynecology

## 2015-09-18 DIAGNOSIS — E559 Vitamin D deficiency, unspecified: Secondary | ICD-10-CM

## 2015-09-18 LAB — URINALYSIS W MICROSCOPIC + REFLEX CULTURE
Bacteria, UA: NONE SEEN [HPF]
Bilirubin Urine: NEGATIVE
Casts: NONE SEEN [LPF]
Crystals: NONE SEEN [HPF]
Glucose, UA: NEGATIVE
Hgb urine dipstick: NEGATIVE
Ketones, ur: NEGATIVE
Leukocytes, UA: NEGATIVE
Nitrite: NEGATIVE
Protein, ur: NEGATIVE
Specific Gravity, Urine: 1.031 (ref 1.001–1.035)
WBC, UA: NONE SEEN WBC/HPF (ref <=5)
Yeast: NONE SEEN [HPF]
pH: 6.5 (ref 5.0–8.0)

## 2015-09-18 LAB — VITAMIN D 25 HYDROXY (VIT D DEFICIENCY, FRACTURES): Vit D, 25-Hydroxy: 28 ng/mL — ABNORMAL LOW (ref 30–100)

## 2015-09-18 MED ORDER — VITAMIN D (ERGOCALCIFEROL) 1.25 MG (50000 UNIT) PO CAPS: ORAL_CAPSULE | ORAL | Status: DC

## 2015-09-19 LAB — URINE CULTURE
Colony Count: NO GROWTH
Organism ID, Bacteria: NO GROWTH

## 2015-09-23 ENCOUNTER — Telehealth: Payer: Self-pay

## 2015-09-23 NOTE — Telephone Encounter (Signed)
Patient called Stephanie Mcdonald as she is spanish speaking. "Patient started taking Vit D April 28 and she states since than her stool is very dark almost black and wants to know if that is normal? "

## 2015-09-23 NOTE — Telephone Encounter (Signed)
Stephanie Mcdonald spoke with patient and informed her. 

## 2015-09-23 NOTE — Telephone Encounter (Signed)
Not necessarily a could be her diet. She could come by the office and we can give her a stool card to check for possible blood in her stool.

## 2015-09-23 NOTE — Telephone Encounter (Signed)
Claudia to call patient and relay below advice.

## 2015-09-28 ENCOUNTER — Other Ambulatory Visit: Payer: Self-pay | Admitting: Anesthesiology

## 2015-09-28 DIAGNOSIS — Z1211 Encounter for screening for malignant neoplasm of colon: Secondary | ICD-10-CM

## 2016-09-19 ENCOUNTER — Ambulatory Visit (INDEPENDENT_AMBULATORY_CARE_PROVIDER_SITE_OTHER): Payer: BLUE CROSS/BLUE SHIELD | Admitting: Gynecology

## 2016-09-19 ENCOUNTER — Encounter: Payer: Self-pay | Admitting: Gynecology

## 2016-09-19 VITALS — BP 102/68 | Ht 64.5 in | Wt 160.4 lb

## 2016-09-19 DIAGNOSIS — E559 Vitamin D deficiency, unspecified: Secondary | ICD-10-CM

## 2016-09-19 DIAGNOSIS — Z01419 Encounter for gynecological examination (general) (routine) without abnormal findings: Secondary | ICD-10-CM

## 2016-09-19 DIAGNOSIS — Z87410 Personal history of cervical dysplasia: Secondary | ICD-10-CM | POA: Diagnosis not present

## 2016-09-19 LAB — LIPID PANEL
Cholesterol: 166 mg/dL (ref <200)
HDL: 64 mg/dL (ref >50)
LDL Cholesterol: 78 mg/dL (ref <100)
Total CHOL/HDL Ratio: 2.6 Ratio (ref <5.0)
Triglycerides: 119 mg/dL (ref <150)
VLDL: 24 mg/dL (ref <30)

## 2016-09-19 LAB — CBC WITH DIFFERENTIAL/PLATELET
Basophils Absolute: 70 cells/uL (ref 0–200)
Basophils Relative: 1 %
Eosinophils Absolute: 490 cells/uL (ref 15–500)
Eosinophils Relative: 7 %
HCT: 38.1 % (ref 35.0–45.0)
Hemoglobin: 12.4 g/dL (ref 11.7–15.5)
Lymphocytes Relative: 42 %
Lymphs Abs: 2940 cells/uL (ref 850–3900)
MCH: 28.7 pg (ref 27.0–33.0)
MCHC: 32.5 g/dL (ref 32.0–36.0)
MCV: 88.2 fL (ref 80.0–100.0)
MPV: 10.6 fL (ref 7.5–12.5)
Monocytes Absolute: 560 cells/uL (ref 200–950)
Monocytes Relative: 8 %
Neutro Abs: 2940 cells/uL (ref 1500–7800)
Neutrophils Relative %: 42 %
Platelets: 225 10*3/uL (ref 140–400)
RBC: 4.32 MIL/uL (ref 3.80–5.10)
RDW: 14.1 % (ref 11.0–15.0)
WBC: 7 10*3/uL (ref 3.8–10.8)

## 2016-09-19 LAB — COMPREHENSIVE METABOLIC PANEL
ALT: 17 U/L (ref 6–29)
AST: 13 U/L (ref 10–30)
Albumin: 4.2 g/dL (ref 3.6–5.1)
Alkaline Phosphatase: 69 U/L (ref 33–115)
BUN: 7 mg/dL (ref 7–25)
CO2: 21 mmol/L (ref 20–31)
Calcium: 9.3 mg/dL (ref 8.6–10.2)
Chloride: 104 mmol/L (ref 98–110)
Creat: 0.64 mg/dL (ref 0.50–1.10)
Glucose, Bld: 102 mg/dL — ABNORMAL HIGH (ref 65–99)
Potassium: 3.7 mmol/L (ref 3.5–5.3)
Sodium: 138 mmol/L (ref 135–146)
Total Bilirubin: 0.4 mg/dL (ref 0.2–1.2)
Total Protein: 7.5 g/dL (ref 6.1–8.1)

## 2016-09-19 NOTE — Progress Notes (Signed)
Stephanie Mcdonald January 01, 1981 [id number]   History:    36 y.o.  for annual gyn exam with no complaints today. Review of patient's record indicated she had the ParaGard T380A IUD placed in 2014. She reports normal menstrual cycles. Patient with past history vitamin D deficiency is currently taking 2000 units daily. All her labs were normal last year with the exception of the vitamin D level being low and her hemoglobin was 11.3 for which she's currently on supplemental iron daily. Review of her records also indicated in 2008 she had a breast biopsy of the right breast and a fibroadenoma was sent pathology report. Also back in 2008 she had CIN-1 confirmed by biopsy and had cryotherapy and subsequent Pap smears of been normal.  Past medical history,surgical history, family history and social history were all reviewed and documented in the EPIC chart.  Gynecologic History Patient's last menstrual period was 08/29/2016 (approximate). Contraception: IUD Last Pap: 2014. Results were: normal Last mammogram: See above. Results were: See above  Obstetric History OB History  Gravida Para Term Preterm AB Living  2 2 2     2   SAB TAB Ectopic Multiple Live Births          2    # Outcome Date GA Lbr Len/2nd Weight Sex Delivery Anes PTL Lv  2 Term     F Vag-Spont  N LIV  1 Term     F Vag-Spont  N LIV       ROS: A ROS was performed and pertinent positives and negatives are included in the history.  GENERAL: No fevers or chills. HEENT: No change in vision, no earache, sore throat or sinus congestion. NECK: No pain or stiffness. CARDIOVASCULAR: No chest pain or pressure. No palpitations. PULMONARY: No shortness of breath, cough or wheeze. GASTROINTESTINAL: No abdominal pain, nausea, vomiting or diarrhea, melena or bright red blood per rectum. GENITOURINARY: No urinary frequency, urgency, hesitancy or dysuria. MUSCULOSKELETAL: No joint or muscle pain, no back pain, no recent trauma. DERMATOLOGIC: No rash, no  itching, no lesions. ENDOCRINE: No polyuria, polydipsia, no heat or cold intolerance. No recent change in weight. HEMATOLOGICAL: No anemia or easy bruising or bleeding. NEUROLOGIC: No headache, seizures, numbness, tingling or weakness. PSYCHIATRIC: No depression, no loss of interest in normal activity or change in sleep pattern.     Exam: chaperone present  BP 102/68   Ht 5' 4.5" (1.638 m)   Wt 160 lb 6.4 oz (72.8 kg)   LMP 08/29/2016 (Approximate)   BMI 27.11 kg/m   Body mass index is 27.11 kg/m.  General appearance : Well developed well nourished female. No acute distress HEENT: Eyes: no retinal hemorrhage or exudates,  Neck supple, trachea midline, no carotid bruits, no thyroidmegaly Lungs: Clear to auscultation, no rhonchi or wheezes, or rib retractions  Heart: Regular rate and rhythm, no murmurs or gallops Breast:Examined in sitting and supine position were symmetrical in appearance, no palpable masses or tenderness,  no skin retraction, no nipple inversion, no nipple discharge, no skin discoloration, no axillary or supraclavicular lymphadenopathy Abdomen: no palpable masses or tenderness, no rebound or guarding Extremities: no edema or skin discoloration or tenderness  Pelvic:  [place], Urethra, Skene Glands: Within normal limits             Vagina: No gross lesions or discharge  Cervix: No gross lesions or discharge, IUD string seen  Uterus  anteverted, normal size, shape and consistency, non-tender and mobile  Adnexa  Without masses or  tenderness  Anus and perineum  normal   Rectovaginal  normal sphincter tone without palpated masses or tenderness             Hemoccult not indicated     Assessment/Plan:  36 y.o. female for annual exam doing well. Pap smear was done today. The following screening blood work was done today: Comprehensive metabolic panel, fasting lipid profile, and CBC. Patient was reminded importance of monthly breast exams. Because her past history  vitamin D deficiency a vitamin D level be checked today.   Terrance Mass MD, 10:41 AM 09/19/2016

## 2016-09-19 NOTE — Addendum Note (Signed)
Addended by: Dorothyann Gibbs on: 09/19/2016 10:53 AM   Modules accepted: Orders

## 2016-09-20 ENCOUNTER — Other Ambulatory Visit: Payer: Self-pay | Admitting: Gynecology

## 2016-09-20 DIAGNOSIS — E559 Vitamin D deficiency, unspecified: Secondary | ICD-10-CM

## 2016-09-20 LAB — VITAMIN D 25 HYDROXY (VIT D DEFICIENCY, FRACTURES): Vit D, 25-Hydroxy: 26 ng/mL — ABNORMAL LOW (ref 30–100)

## 2016-09-20 MED ORDER — VITAMIN D (ERGOCALCIFEROL) 1.25 MG (50000 UNIT) PO CAPS: ORAL_CAPSULE | ORAL | 0 refills | Status: DC

## 2016-09-22 LAB — PAP, TP IMAGING W/ HPV RNA, RFLX HPV TYPE 16,18/45: HPV mRNA, High Risk: NOT DETECTED

## 2016-10-05 ENCOUNTER — Encounter: Payer: Self-pay | Admitting: Gynecology

## 2016-12-07 DIAGNOSIS — M25511 Pain in right shoulder: Secondary | ICD-10-CM | POA: Diagnosis not present

## 2016-12-09 ENCOUNTER — Other Ambulatory Visit: Payer: Self-pay | Admitting: Gynecology

## 2016-12-29 DIAGNOSIS — M6281 Muscle weakness (generalized): Secondary | ICD-10-CM | POA: Diagnosis not present

## 2016-12-29 DIAGNOSIS — R293 Abnormal posture: Secondary | ICD-10-CM | POA: Diagnosis not present

## 2016-12-29 DIAGNOSIS — M25511 Pain in right shoulder: Secondary | ICD-10-CM | POA: Diagnosis not present

## 2017-11-07 ENCOUNTER — Encounter: Payer: BLUE CROSS/BLUE SHIELD | Admitting: Obstetrics & Gynecology

## 2017-12-19 ENCOUNTER — Encounter: Payer: BLUE CROSS/BLUE SHIELD | Admitting: Obstetrics & Gynecology

## 2018-01-01 ENCOUNTER — Encounter: Payer: Self-pay | Admitting: Obstetrics & Gynecology

## 2018-01-01 ENCOUNTER — Other Ambulatory Visit: Payer: Self-pay | Admitting: Obstetrics & Gynecology

## 2018-01-01 ENCOUNTER — Ambulatory Visit (INDEPENDENT_AMBULATORY_CARE_PROVIDER_SITE_OTHER): Payer: BLUE CROSS/BLUE SHIELD | Admitting: Obstetrics & Gynecology

## 2018-01-01 VITALS — BP 126/84 | Ht 64.5 in | Wt 164.0 lb

## 2018-01-01 DIAGNOSIS — R7309 Other abnormal glucose: Secondary | ICD-10-CM | POA: Diagnosis not present

## 2018-01-01 DIAGNOSIS — Z30431 Encounter for routine checking of intrauterine contraceptive device: Secondary | ICD-10-CM | POA: Diagnosis not present

## 2018-01-01 DIAGNOSIS — Z1151 Encounter for screening for human papillomavirus (HPV): Secondary | ICD-10-CM | POA: Diagnosis not present

## 2018-01-01 DIAGNOSIS — N870 Mild cervical dysplasia: Secondary | ICD-10-CM

## 2018-01-01 DIAGNOSIS — R300 Dysuria: Secondary | ICD-10-CM | POA: Diagnosis not present

## 2018-01-01 DIAGNOSIS — N898 Other specified noninflammatory disorders of vagina: Secondary | ICD-10-CM

## 2018-01-01 DIAGNOSIS — Z01419 Encounter for gynecological examination (general) (routine) without abnormal findings: Secondary | ICD-10-CM

## 2018-01-01 LAB — WET PREP FOR TRICH, YEAST, CLUE

## 2018-01-01 NOTE — Addendum Note (Signed)
Addended by: Princess Bruins on: 01/01/2018 09:56 AM   Modules accepted: Orders

## 2018-01-01 NOTE — Patient Instructions (Signed)
1. Encounter for routine gynecological examination with Papanicolaou smear of cervix Normal gynecologic exam.  Pap test with high risk HPV done.  Breast exam normal.  Fasting health labs here today.  Vitamin D included because of history of low vitamin D.  Body mass index 27.72.  Recommend lower calorie/carb diet such as [employer] and regular physical activity with aerobic activities 5 times a week and weightlifting every 2 days. - CBC - Comp Met (CMET) - Lipid panel - TSH - VITAMIN D 25 Hydroxy (Vit-D Deficiency, Fractures)  2. Encounter for routine checking of intrauterine contraceptive device (IUD) Well on Paragard IUD x 10/2011.  IUD in good position.  3. Vaginal discharge Wet prep negative, reassured. - WET PREP FOR [place], YEAST, CLUE  4. Dysplasia of cervix, low grade (CIN 1) H/O CIN 1.  Last Pap 08/2016 negative/HPV HR neg.  Pricilla Riffle un placer encontrarle hoy!  Voy a informarle de sus [employer].

## 2018-01-01 NOTE — Progress Notes (Addendum)
Stephanie Mcdonald Jun 19, 1980 [id number]   History:    37 y.o. G2P2L2 Married.  Daughters 46 and 1 yo, doing well.  RP:  Established patient presenting for annual gyn exam   HPI: Paraguard IUD x 10/2011.  Menses regular normal every month.  No BTB.  No pelvic pain, occasionally feels the ovulation.  BMs wnl.  Mild burning with miction and frequency x a few days.  Mild increase in vaginal discharge, but no odor/itching.  H/O CIN 1.  Last Pap negative/HPV HR neg 08/2016.  Breasts wnl.  BMI 27.72.  Fasting for Health labs today.  Past medical history,surgical history, family history and social history were all reviewed and documented in the EPIC chart.  Gynecologic History Patient's last menstrual period was 12/15/2017. Contraception: IUD Last Pap: 08/2016. Results were: Negative/HPV HR neg Last mammogram: Never Bone Density: Never Colonoscopy: Never  Obstetric History OB History  Gravida Para Term Preterm AB Living  2 2 2     2   SAB TAB Ectopic Multiple Live Births          2    # Outcome Date GA Lbr Len/2nd Weight Sex Delivery Anes PTL Lv  2 Term     F Vag-Spont  N LIV  1 Term     F Vag-Spont  N LIV     ROS: A ROS was performed and pertinent positives and negatives are included in the history.  GENERAL: No fevers or chills. HEENT: No change in vision, no earache, sore throat or sinus congestion. NECK: No pain or stiffness. CARDIOVASCULAR: No chest pain or pressure. No palpitations. PULMONARY: No shortness of breath, cough or wheeze. GASTROINTESTINAL: No abdominal pain, nausea, vomiting or diarrhea, melena or bright red blood per rectum. GENITOURINARY: No urinary frequency, urgency, hesitancy or dysuria. MUSCULOSKELETAL: No joint or muscle pain, no back pain, no recent trauma. DERMATOLOGIC: No rash, no itching, no lesions. ENDOCRINE: No polyuria, polydipsia, no heat or cold intolerance. No recent change in weight. HEMATOLOGICAL: No anemia or easy bruising or bleeding. NEUROLOGIC: No  headache, seizures, numbness, tingling or weakness. PSYCHIATRIC: No depression, no loss of interest in normal activity or change in sleep pattern.     Exam:   BP 126/84   Ht 5' 4.5" (1.638 m)   Wt 164 lb (74.4 kg)   LMP 12/15/2017   BMI 27.72 kg/m   Body mass index is 27.72 kg/m.  General appearance : Well developed well nourished female. No acute distress HEENT: Eyes: no retinal hemorrhage or exudates,  Neck supple, trachea midline, no carotid bruits, no thyroidmegaly Lungs: Clear to auscultation, no rhonchi or wheezes, or rib retractions  Heart: Regular rate and rhythm, no murmurs or gallops Breast:Examined in sitting and supine position were symmetrical in appearance, no palpable masses or tenderness,  no skin retraction, no nipple inversion, no nipple discharge, no skin discoloration, no axillary or supraclavicular lymphadenopathy Abdomen: no palpable masses or tenderness, no rebound or guarding Extremities: no edema or skin discoloration or tenderness  Pelvic: Vulva: Normal             Vagina: No gross lesions or discharge  Cervix: No gross lesions or discharge.  Pap/HPV HR done.  Uterus  AV, normal size, shape and consistency, non-tender and mobile  Adnexa  Without masses or tenderness  Anus: Normal  Wet prep negative  U/A: Yellow cloudy, protein negative, nitrites negative, white blood cells 0-5, red blood cells 0-2, bacteria few.  Urine culture pending.  Assessment/Plan:  37 y.o.  female for annual exam   1. Encounter for routine gynecological examination with Papanicolaou smear of cervix Normal gynecologic exam.  Pap test with high risk HPV done.  Breast exam normal.  Fasting health labs here today.  Vitamin D included because of history of low vitamin D.  Body mass index 27.72.  Recommend lower calorie/carb diet such as [employer] and regular physical activity with aerobic activities 5 times a week and weightlifting every 2 days. - CBC - Comp Met (CMET) - Lipid  panel - TSH - VITAMIN D 25 Hydroxy (Vit-D Deficiency, Fractures)  2. Encounter for routine checking of intrauterine contraceptive device (IUD) Well on Paragard IUD x 10/2011.  IUD in good position.  3. Vaginal discharge Wet prep negative, reassured. - WET PREP FOR [employer], YEAST, CLUE  4. Dysuria Urine analysis mildly perturbed.  Will wait on urine culture to decide on treatment. - Urinalysis,Complete w/RFL Culture  5. Dysplasia of cervix, low grade (CIN 1) H/O CIN 1.  Last Pap 08/2016 negative/HPV HR neg.  Princess Bruins MD, 9:23 AM 01/01/2018

## 2018-01-01 NOTE — Addendum Note (Signed)
Addended by: Thurnell Garbe A on: 01/01/2018 09:59 AM   Modules accepted: Orders

## 2018-01-02 LAB — PAP, TP IMAGING W/ HPV RNA, RFLX HPV TYPE 16,18/45: HPV DNA High Risk: NOT DETECTED

## 2018-01-03 LAB — URINE CULTURE
MICRO NUMBER:: 90955184
SPECIMEN QUALITY:: ADEQUATE

## 2018-01-04 LAB — TEST AUTHORIZATION

## 2018-01-04 LAB — CBC
HCT: 35.1 % (ref 35.0–45.0)
Hemoglobin: 11.4 g/dL — ABNORMAL LOW (ref 11.7–15.5)
MCH: 27 pg (ref 27.0–33.0)
MCHC: 32.5 g/dL (ref 32.0–36.0)
MCV: 83.2 fL (ref 80.0–100.0)
MPV: 11.2 fL (ref 7.5–12.5)
Platelets: 264 10*3/uL (ref 140–400)
RBC: 4.22 10*6/uL (ref 3.80–5.10)
RDW: 14.5 % (ref 11.0–15.0)
WBC: 7.4 10*3/uL (ref 3.8–10.8)

## 2018-01-04 LAB — COMPREHENSIVE METABOLIC PANEL
AG Ratio: 1.4 (calc) (ref 1.0–2.5)
ALT: 13 U/L (ref 6–29)
AST: 12 U/L (ref 10–30)
Albumin: 4.2 g/dL (ref 3.6–5.1)
Alkaline phosphatase (APISO): 74 U/L (ref 33–115)
BUN: 9 mg/dL (ref 7–25)
CO2: 25 mmol/L (ref 20–32)
Calcium: 9.1 mg/dL (ref 8.6–10.2)
Chloride: 104 mmol/L (ref 98–110)
Creat: 0.72 mg/dL (ref 0.50–1.10)
Globulin: 3.1 g/dL (calc) (ref 1.9–3.7)
Glucose, Bld: 111 mg/dL — ABNORMAL HIGH (ref 65–99)
Potassium: 3.9 mmol/L (ref 3.5–5.3)
Sodium: 138 mmol/L (ref 135–146)
Total Bilirubin: 0.4 mg/dL (ref 0.2–1.2)
Total Protein: 7.3 g/dL (ref 6.1–8.1)

## 2018-01-04 LAB — LIPID PANEL
Cholesterol: 165 mg/dL (ref <200)
HDL: 63 mg/dL (ref >50)
LDL Cholesterol (Calc): 83 mg/dL (calc)
Non-HDL Cholesterol (Calc): 102 mg/dL (calc) (ref <130)
Total CHOL/HDL Ratio: 2.6 (calc) (ref <5.0)
Triglycerides: 91 mg/dL (ref <150)

## 2018-01-04 LAB — HEMOGLOBIN A1C
Hgb A1c MFr Bld: 5.7 % of total Hgb — ABNORMAL HIGH (ref <5.7)
Mean Plasma Glucose: 117 (calc)
eAG (mmol/L): 6.5 (calc)

## 2018-01-04 LAB — VITAMIN D 25 HYDROXY (VIT D DEFICIENCY, FRACTURES): Vit D, 25-Hydroxy: 27 ng/mL — ABNORMAL LOW (ref 30–100)

## 2018-01-04 LAB — TSH: TSH: 2.48 mIU/L

## 2018-02-12 LAB — URINALYSIS, COMPLETE W/RFL CULTURE
Bilirubin Urine: NEGATIVE
Glucose, UA: NEGATIVE
Hyaline Cast: NONE SEEN /LPF
Ketones, ur: NEGATIVE
Leukocyte Esterase: NEGATIVE
Nitrites, Initial: NEGATIVE
Protein, ur: NEGATIVE
Specific Gravity, Urine: 1.026 (ref 1.001–1.03)
pH: 5.5 (ref 5.0–8.0)

## 2018-02-12 LAB — URINE CULTURE

## 2018-02-12 LAB — CULTURE INDICATED

## 2018-03-26 ENCOUNTER — Encounter: Payer: Self-pay | Admitting: Obstetrics & Gynecology

## 2018-03-26 ENCOUNTER — Ambulatory Visit (INDEPENDENT_AMBULATORY_CARE_PROVIDER_SITE_OTHER): Payer: BLUE CROSS/BLUE SHIELD | Admitting: Obstetrics & Gynecology

## 2018-03-26 VITALS — BP 126/78

## 2018-03-26 DIAGNOSIS — Z30431 Encounter for routine checking of intrauterine contraceptive device: Secondary | ICD-10-CM | POA: Diagnosis not present

## 2018-03-26 DIAGNOSIS — N898 Other specified noninflammatory disorders of vagina: Secondary | ICD-10-CM

## 2018-03-26 DIAGNOSIS — Z113 Encounter for screening for infections with a predominantly sexual mode of transmission: Secondary | ICD-10-CM | POA: Diagnosis not present

## 2018-03-26 LAB — WET PREP FOR TRICH, YEAST, CLUE

## 2018-03-26 NOTE — Progress Notes (Signed)
    Darlyne Schmiesing Jul 04, 1980 [id number]        37 y.o.  G2P2002  Married  RP: Vaginal discharge/STD screen  HPI: Suspicion of infidelity from husband.  Increased vaginal d/c, itching, pelvic tenderness x 2 weeks.  No fever.  Has ParaGard IUD for contraception.   OB History  Gravida Para Term Preterm AB Living  2 2 2     2   SAB TAB Ectopic Multiple Live Births          2    # Outcome Date GA Lbr Len/2nd Weight Sex Delivery Anes PTL Lv  2 Term     F Vag-Spont  N LIV  1 Term     F Vag-Spont  N LIV    Past medical history,surgical history, problem list, medications, allergies, family history and social history were all reviewed and documented in the EPIC chart.   Directed ROS with pertinent positives and negatives documented in the history of present illness/assessment and plan.  Exam:  Vitals:   03/26/18 1429  BP: 126/78   General appearance:  Normal  Abdomen: Normal  Gynecologic exam: Vulva normal.  Speculum: Cervix normal.  IUD strings visible.  Normal vaginal secretions.  Gonorrhea and Chlamydia done.  Wet prep done.  Bimanual exam: Uterus anteverted, normal volume, mobile, nontender.  No adnexal mass felt, nontender bilaterally.  Wet prep: Negative  Assessment/Plan:  37 y.o. G2P2002   1. Vaginal discharge Wet prep negative.  Patient reassured.  Can use probiotic or boric acid over-the-counter as needed. - WET PREP FOR [place], YEAST, CLUE  2. Screening examination for venereal disease Normal gynecologic exam.  Pending STD screening. - C. trachomatis/N. gonorrhoeae RNA - HIV antibody (with reflex) - RPR - Hepatitis C Antibody - Hepatitis B Surface AntiGEN - HSV(herpes simplex vrs) 1+2 ab-IgG  3. Encounter for routine checking of intrauterine contraceptive device (IUD) ParaGard IUD in good location and well-tolerated.  Patient reassured.  Counseling on above issues and coordination of care more than 50% for 25 minutes.  Princess Bruins MD, 2:49 PM  03/26/2018

## 2018-03-27 LAB — HIV ANTIBODY (ROUTINE TESTING W REFLEX): HIV 1&2 Ab, 4th Generation: NONREACTIVE

## 2018-03-27 LAB — HSV(HERPES SIMPLEX VRS) I + II AB-IGG
HAV 1 IGG,TYPE SPECIFIC AB: 26.1 index — ABNORMAL HIGH
HSV 2 IGG,TYPE SPECIFIC AB: <0.9 index

## 2018-03-27 LAB — C. TRACHOMATIS/N. GONORRHOEAE RNA
C. trachomatis RNA, TMA: NOT DETECTED
N. gonorrhoeae RNA, TMA: NOT DETECTED

## 2018-03-27 LAB — RPR: RPR Ser Ql: NONREACTIVE

## 2018-03-27 LAB — HEPATITIS C ANTIBODY
Hepatitis C Ab: NONREACTIVE
SIGNAL TO CUT-OFF: 0.01 (ref <1.00)

## 2018-03-27 LAB — HEPATITIS B SURFACE ANTIGEN: Hepatitis B Surface Ag: NONREACTIVE

## 2018-04-02 ENCOUNTER — Encounter: Payer: Self-pay | Admitting: Obstetrics & Gynecology

## 2018-04-02 NOTE — Patient Instructions (Signed)
1. Vaginal discharge Wet prep negative.  Patient reassured.  Can use probiotic or boric acid over-the-counter as needed. - WET PREP FOR [place], YEAST, CLUE  2. Screening examination for venereal disease Normal gynecologic exam.  Pending STD screening. - C. trachomatis/N. gonorrhoeae RNA - HIV antibody (with reflex) - RPR - Hepatitis C Antibody - Hepatitis B Surface AntiGEN - HSV(herpes simplex vrs) 1+2 ab-IgG  3. Encounter for routine checking of intrauterine contraceptive device (IUD) ParaGard IUD in good location and well-tolerated.  Patient reassured.  Pricilla Riffle un placer verle hoy!  Voy a informarle de sus [employer].

## 2018-05-29 DIAGNOSIS — S61211A Laceration without foreign body of left index finger without damage to nail, initial encounter: Secondary | ICD-10-CM | POA: Diagnosis not present

## 2018-05-30 MED ORDER — VALACYCLOVIR HCL 1 G PO TABS: ORAL_TABLET | ORAL | 3 refills | Status: DC

## 2018-08-06 ENCOUNTER — Ambulatory Visit (INDEPENDENT_AMBULATORY_CARE_PROVIDER_SITE_OTHER): Payer: BLUE CROSS/BLUE SHIELD | Admitting: Obstetrics & Gynecology

## 2018-08-06 ENCOUNTER — Encounter: Payer: Self-pay | Admitting: Obstetrics & Gynecology

## 2018-08-06 ENCOUNTER — Other Ambulatory Visit: Payer: Self-pay

## 2018-08-06 VITALS — BP 126/78

## 2018-08-06 DIAGNOSIS — N9089 Other specified noninflammatory disorders of vulva and perineum: Secondary | ICD-10-CM

## 2018-08-06 DIAGNOSIS — Z113 Encounter for screening for infections with a predominantly sexual mode of transmission: Secondary | ICD-10-CM | POA: Diagnosis not present

## 2018-08-06 NOTE — Progress Notes (Signed)
    Stephanie Mcdonald April 27, 1981 [id number]        38 y.o.  G2P2002 Married  RP: Left tender vulvar lesion  HPI:  Noticed a tender irritated area on the left small labia of the vulva.  No pelvic pain.  Normal regular menstrual periods.  Well on Paragard IUD.  HSV 1 IGG positive in 03/2018.  On Valacyclovir prophylaxis.  Otherwise STI screen negative 03/2018, done because patient suspected infedility of husband.   OB History  Gravida Para Term Preterm AB Living  2 2 2     2   SAB TAB Ectopic Multiple Live Births          2    # Outcome Date GA Lbr Len/2nd Weight Sex Delivery Anes PTL Lv  2 Term     F Vag-Spont  N LIV  1 Term     F Vag-Spont  N LIV    Past medical history,surgical history, problem list, medications, allergies, family history and social history were all reviewed and documented in the EPIC chart.   Directed ROS with pertinent positives and negatives documented in the history of present illness/assessment and plan.  Exam:  Vitals:   08/06/18 1011  BP: 126/78   General appearance:  Normal  Abdomen: Normal  Gynecologic exam: Vulva:  Left labia minora with a small ulcerated nodule.  HSV?  SureSwab done.  Speculum:  Cervix/Vagina normal.  Gono-Chlam done.  Normal vaginal secretions.  Bimanual exam:  Uterus AV, normal volume, NT, mobile.  No adnexal mass, NT.   Assessment/Plan:  38 y.o. G2P2002   1. Vulvar lesion Possible sebaceous gland cyst/abscess.  R/O Herpes lesion.  SureSwab done.  Patient already on Valacyclovir prophylaxis given HSV 1 IGG positive 03/2018..   - SureSwab HSV, Type 1/2 DNA, PCR  2. Screen for STD (sexually transmitted disease) Strict condom use recommended. - HIV antibody (with reflex) - RPR - Hepatitis C Antibody - Hepatitis B Surface AntiGEN - C. trachomatis/N. gonorrhoeae RNA  Counseling on above issues and coordination of care more than 50% for 25 minutes.  Princess Bruins MD, 10:34 AM 08/06/2018

## 2018-08-07 ENCOUNTER — Encounter: Payer: Self-pay | Admitting: Obstetrics & Gynecology

## 2018-08-07 LAB — HEPATITIS C ANTIBODY
Hepatitis C Ab: NONREACTIVE
SIGNAL TO CUT-OFF: 0.01 (ref <1.00)

## 2018-08-07 LAB — C. TRACHOMATIS/N. GONORRHOEAE RNA
C. trachomatis RNA, TMA: NOT DETECTED
N. gonorrhoeae RNA, TMA: NOT DETECTED

## 2018-08-07 LAB — RPR: RPR Ser Ql: NONREACTIVE

## 2018-08-07 LAB — HIV ANTIBODY (ROUTINE TESTING W REFLEX): HIV 1&2 Ab, 4th Generation: NONREACTIVE

## 2018-08-07 LAB — HEPATITIS B SURFACE ANTIGEN: Hepatitis B Surface Ag: NONREACTIVE

## 2018-08-07 NOTE — Patient Instructions (Signed)
1. Vulvar lesion Possible sebaceous gland cyst/abscess.  R/O Herpes lesion.  SureSwab done.  Patient already on Valacyclovir prophylaxis given HSV 1 IGG positive 03/2018..   - SureSwab HSV, Type 1/2 DNA, PCR  2. Screen for STD (sexually transmitted disease) Strict condom use recommended. - HIV antibody (with reflex) - RPR - Hepatitis C Antibody - Hepatitis B Surface AntiGEN - C. trachomatis/N. gonorrhoeae RNA  Stephanie Mcdonald, it was a pleasure seeing you today!  I will inform you of your results as soon as they are available.

## 2018-08-08 LAB — SURESWAB HSV, TYPE 1/2 DNA, PCR
HSV 1 DNA: NOT DETECTED
HSV 2 DNA: NOT DETECTED

## 2018-09-14 DIAGNOSIS — N3001 Acute cystitis with hematuria: Secondary | ICD-10-CM | POA: Diagnosis not present

## 2018-09-14 DIAGNOSIS — Z113 Encounter for screening for infections with a predominantly sexual mode of transmission: Secondary | ICD-10-CM | POA: Diagnosis not present

## 2019-01-01 ENCOUNTER — Other Ambulatory Visit: Payer: Self-pay | Admitting: Obstetrics & Gynecology

## 2019-01-03 ENCOUNTER — Encounter: Payer: BLUE CROSS/BLUE SHIELD | Admitting: Obstetrics & Gynecology

## 2019-01-10 ENCOUNTER — Ambulatory Visit (INDEPENDENT_AMBULATORY_CARE_PROVIDER_SITE_OTHER): Payer: BC Managed Care – PPO | Admitting: Obstetrics & Gynecology

## 2019-01-10 ENCOUNTER — Other Ambulatory Visit: Payer: Self-pay

## 2019-01-10 ENCOUNTER — Encounter: Payer: Self-pay | Admitting: Obstetrics & Gynecology

## 2019-01-10 VITALS — BP 126/84 | Ht 64.5 in | Wt 157.0 lb

## 2019-01-10 DIAGNOSIS — R7309 Other abnormal glucose: Secondary | ICD-10-CM | POA: Diagnosis not present

## 2019-01-10 DIAGNOSIS — Z1151 Encounter for screening for human papillomavirus (HPV): Secondary | ICD-10-CM

## 2019-01-10 DIAGNOSIS — Z30431 Encounter for routine checking of intrauterine contraceptive device: Secondary | ICD-10-CM

## 2019-01-10 DIAGNOSIS — Z01419 Encounter for gynecological examination (general) (routine) without abnormal findings: Secondary | ICD-10-CM

## 2019-01-10 DIAGNOSIS — Z113 Encounter for screening for infections with a predominantly sexual mode of transmission: Secondary | ICD-10-CM

## 2019-01-10 NOTE — Progress Notes (Signed)
Stephanie Mcdonald 12-03-1980 [id number]   History:    37 y.o. G2P2L2 Separated  RP:  Established patient presenting for annual gyn exam   HPI:  Paragard IUD x 2013.  Normal menses every month.  Sexually active with husband, from whom she is separated, 1 month ago.  Had BTB at mid-cycle this month and mild pelvic cramps.  No vaginal d/c.  No fever.  Urine/BMs normal.  Breasts normal.  Improved BMI at 26.53 now.  Will do Health labs here.  Past medical history,surgical history, family history and social history were all reviewed and documented in the EPIC chart.  Gynecologic History Patient's last menstrual period was 12/30/2018. Contraception: Paragard IUD x 2013 Last Pap: 12/2017. Results were: Negative/HPV HR negative Last mammogram: Never Bone Density: Never Colonoscopy: Never  Obstetric History OB History  Gravida Para Term Preterm AB Living  2 2 2     2   SAB TAB Ectopic Multiple Live Births          2    # Outcome Date GA Lbr Len/2nd Weight Sex Delivery Anes PTL Lv  2 Term     F Vag-Spont  N LIV  1 Term     F Vag-Spont  N LIV     ROS: A ROS was performed and pertinent positives and negatives are included in the history.  GENERAL: No fevers or chills. HEENT: No change in vision, no earache, sore throat or sinus congestion. NECK: No pain or stiffness. CARDIOVASCULAR: No chest pain or pressure. No palpitations. PULMONARY: No shortness of breath, cough or wheeze. GASTROINTESTINAL: No abdominal pain, nausea, vomiting or diarrhea, melena or bright red blood per rectum. GENITOURINARY: No urinary frequency, urgency, hesitancy or dysuria. MUSCULOSKELETAL: No joint or muscle pain, no back pain, no recent trauma. DERMATOLOGIC: No rash, no itching, no lesions. ENDOCRINE: No polyuria, polydipsia, no heat or cold intolerance. No recent change in weight. HEMATOLOGICAL: No anemia or easy bruising or bleeding. NEUROLOGIC: No headache, seizures, numbness, tingling or weakness. PSYCHIATRIC: No  depression, no loss of interest in normal activity or change in sleep pattern.     Exam:   BP 126/84   Ht 5' 4.5" (1.638 m)   Wt 157 lb (71.2 kg)   LMP 12/30/2018 Comment: paragard  BMI 26.53 kg/m   Body mass index is 26.53 kg/m.  General appearance : Well developed well nourished female. No acute distress HEENT: Eyes: no retinal hemorrhage or exudates,  Neck supple, trachea midline, no carotid bruits, no thyroidmegaly Lungs: Clear to auscultation, no rhonchi or wheezes, or rib retractions  Heart: Regular rate and rhythm, no murmurs or gallops Breast:Examined in sitting and supine position were symmetrical in appearance, no palpable masses or tenderness,  no skin retraction, no nipple inversion, no nipple discharge, no skin discoloration, no axillary or supraclavicular lymphadenopathy Abdomen: no palpable masses or tenderness, no rebound or guarding Extremities: no edema or skin discoloration or tenderness  Pelvic: Vulva: Normal             Vagina: No gross lesions or discharge  Cervix: No gross lesions or discharge.  IUD strings visible at [facility].  Pap/HPV HR, Gono-Chlam done.  Uterus  AV, normal size, shape and consistency, non-tender and mobile  Adnexa  Without masses or tenderness  Anus: Normal   Assessment/Plan:  38 y.o. female for annual exam   1. Encounter for routine gynecological examination with Papanicolaou smear of cervix Normal gynecologic exam.  Pap with high-risk HPV done today.  Breast exam  normal.  Will do CBC TSH and vitamin D today and follow-up for fasting labs.  Body mass index 26.53.  Continue with fitness and healthy nutrition. - CBC - TSH - VITAMIN D 25 Hydroxy (Vit-D Deficiency, Fractures) - Lipid panel; Future - Comp Met (CMET); Future  2. Encounter for routine checking of intrauterine contraceptive device (IUD) ParaGard IUD well-tolerated and in good position, in place since 2013.  Recommend removing at 8 weeks rather than waiting the full 10 years.   3. Screen for STD (sexually transmitted disease) Strict condom use recommended. - HIV antibody (with reflex) - RPR - Hepatitis C Antibody - Hepatitis B Surface AntiGEN -Gonorrhea and chlamydia done on the Pap test.  Other orders - vitamin B-12 (CYANOCOBALAMIN) 100 MCG tablet; Take 100 mcg by mouth daily.  Princess Bruins MD, 4:41 PM 01/10/2019

## 2019-01-11 ENCOUNTER — Other Ambulatory Visit: Payer: Self-pay

## 2019-01-11 ENCOUNTER — Other Ambulatory Visit: Payer: BC Managed Care – PPO

## 2019-01-11 DIAGNOSIS — Z01419 Encounter for gynecological examination (general) (routine) without abnormal findings: Secondary | ICD-10-CM | POA: Diagnosis not present

## 2019-01-12 LAB — COMPREHENSIVE METABOLIC PANEL
AG Ratio: 1.4 (calc) (ref 1.0–2.5)
ALT: 15 U/L (ref 6–29)
AST: 14 U/L (ref 10–30)
Albumin: 4.3 g/dL (ref 3.6–5.1)
Alkaline phosphatase (APISO): 80 U/L (ref 31–125)
BUN: 10 mg/dL (ref 7–25)
CO2: 23 mmol/L (ref 20–32)
Calcium: 9.3 mg/dL (ref 8.6–10.2)
Chloride: 103 mmol/L (ref 98–110)
Creat: 0.81 mg/dL (ref 0.50–1.10)
Globulin: 3.1 g/dL (calc) (ref 1.9–3.7)
Glucose, Bld: 105 mg/dL — ABNORMAL HIGH (ref 65–99)
Potassium: 4.1 mmol/L (ref 3.5–5.3)
Sodium: 138 mmol/L (ref 135–146)
Total Bilirubin: 0.6 mg/dL (ref 0.2–1.2)
Total Protein: 7.4 g/dL (ref 6.1–8.1)

## 2019-01-12 LAB — LIPID PANEL
Cholesterol: 160 mg/dL (ref <200)
HDL: 65 mg/dL (ref >=50)
LDL Cholesterol (Calc): 80 mg/dL (calc)
Non-HDL Cholesterol (Calc): 95 mg/dL (calc) (ref <130)
Total CHOL/HDL Ratio: 2.5 (calc) (ref <5.0)
Triglycerides: 71 mg/dL (ref <150)

## 2019-01-14 ENCOUNTER — Encounter: Payer: Self-pay | Admitting: Obstetrics & Gynecology

## 2019-01-14 LAB — PAP IG, CT-NG NAA, HPV HIGH-RISK
C. trachomatis RNA, TMA: NOT DETECTED
HPV DNA High Risk: NOT DETECTED
N. gonorrhoeae RNA, TMA: NOT DETECTED

## 2019-01-14 NOTE — Patient Instructions (Signed)
1. Encounter for routine gynecological examination with Papanicolaou smear of cervix Normal gynecologic exam.  Pap with high-risk HPV done today.  Breast exam normal.  Will do CBC TSH and vitamin D today and follow-up for fasting labs.  Body mass index 26.53.  Continue with fitness and healthy nutrition. - CBC - TSH - VITAMIN D 25 Hydroxy (Vit-D Deficiency, Fractures) - Lipid panel; Future - Comp Met (CMET); Future  2. Encounter for routine checking of intrauterine contraceptive device (IUD) ParaGard IUD well-tolerated and in good position, in place since 2013.  Recommend removing at 8 weeks rather than waiting the full 10 years.  3. Screen for STD (sexually transmitted disease) Strict condom use recommended. - HIV antibody (with reflex) - RPR - Hepatitis C Antibody - Hepatitis B Surface AntiGEN -Gonorrhea and chlamydia done on the Pap test.  Other orders - vitamin B-12 (CYANOCOBALAMIN) 100 MCG tablet; Take 100 mcg by mouth daily.  Stephanie Mcdonald, it was a pleasure seeing you today!  I will inform you of your results as soon as they are available. 

## 2019-01-15 ENCOUNTER — Other Ambulatory Visit: Payer: Self-pay

## 2019-01-15 DIAGNOSIS — E559 Vitamin D deficiency, unspecified: Secondary | ICD-10-CM

## 2019-01-15 MED ORDER — VITAMIN D (ERGOCALCIFEROL) 1.25 MG (50000 UNIT) PO CAPS: 50000.0000 [IU] | ORAL_CAPSULE | ORAL | 0 refills | Status: DC

## 2019-01-16 LAB — VITAMIN D 25 HYDROXY (VIT D DEFICIENCY, FRACTURES): Vit D, 25-Hydroxy: 24 ng/mL — ABNORMAL LOW (ref 30–100)

## 2019-01-16 LAB — HEPATITIS C ANTIBODY
Hepatitis C Ab: NONREACTIVE
SIGNAL TO CUT-OFF: 0.01 (ref <1.00)

## 2019-01-16 LAB — CBC
HCT: 37.2 % (ref 35.0–45.0)
Hemoglobin: 12.2 g/dL (ref 11.7–15.5)
MCH: 27.5 pg (ref 27.0–33.0)
MCHC: 32.8 g/dL (ref 32.0–36.0)
MCV: 84 fL (ref 80.0–100.0)
MPV: 11.1 fL (ref 7.5–12.5)
Platelets: 304 10*3/uL (ref 140–400)
RBC: 4.43 10*6/uL (ref 3.80–5.10)
RDW: 14 % (ref 11.0–15.0)
WBC: 9.3 10*3/uL (ref 3.8–10.8)

## 2019-01-16 LAB — TSH: TSH: 1.1 mIU/L

## 2019-01-16 LAB — HEMOGLOBIN A1C W/OUT EAG: Hgb A1c MFr Bld: 5.8 % of total Hgb — ABNORMAL HIGH (ref <5.7)

## 2019-01-16 LAB — TEST AUTHORIZATION

## 2019-01-16 LAB — RPR: RPR Ser Ql: NONREACTIVE

## 2019-01-16 LAB — HEPATITIS B SURFACE ANTIGEN: Hepatitis B Surface Ag: NONREACTIVE

## 2019-01-16 LAB — HIV ANTIBODY (ROUTINE TESTING W REFLEX): HIV 1&2 Ab, 4th Generation: NONREACTIVE

## 2019-06-13 ENCOUNTER — Other Ambulatory Visit: Payer: Self-pay | Admitting: Obstetrics & Gynecology

## 2019-06-13 DIAGNOSIS — E559 Vitamin D deficiency, unspecified: Secondary | ICD-10-CM

## 2019-06-14 ENCOUNTER — Other Ambulatory Visit: Payer: Self-pay

## 2019-06-14 DIAGNOSIS — E559 Vitamin D deficiency, unspecified: Secondary | ICD-10-CM

## 2019-06-14 MED ORDER — VITAMIN D (ERGOCALCIFEROL) 1.25 MG (50000 UNIT) PO CAPS: 50000.0000 [IU] | ORAL_CAPSULE | ORAL | 0 refills | Status: DC

## 2019-06-14 NOTE — Telephone Encounter (Signed)
Back in August 2020 patient's Vit D level was 24 and you prescribed Vitamin D 50,000 units. She said she took one and lost the bottle and has not thought about it again until now. She wondered if you would send her in another Rx.  Ok to send Vit D 50,000?

## 2019-06-14 NOTE — Telephone Encounter (Signed)
Spoke with patient and informed her. Rx sent. 

## 2019-08-26 ENCOUNTER — Other Ambulatory Visit: Payer: Self-pay

## 2019-08-27 ENCOUNTER — Ambulatory Visit: Payer: BC Managed Care – PPO | Admitting: Obstetrics & Gynecology

## 2019-09-03 ENCOUNTER — Other Ambulatory Visit: Payer: Self-pay | Admitting: Obstetrics & Gynecology

## 2019-09-03 DIAGNOSIS — E559 Vitamin D deficiency, unspecified: Secondary | ICD-10-CM

## 2019-09-12 ENCOUNTER — Other Ambulatory Visit: Payer: Self-pay

## 2019-09-12 ENCOUNTER — Ambulatory Visit (INDEPENDENT_AMBULATORY_CARE_PROVIDER_SITE_OTHER): Payer: BC Managed Care – PPO | Admitting: Obstetrics & Gynecology

## 2019-09-12 ENCOUNTER — Encounter: Payer: Self-pay | Admitting: Obstetrics & Gynecology

## 2019-09-12 VITALS — BP 126/84

## 2019-09-12 DIAGNOSIS — Z30431 Encounter for routine checking of intrauterine contraceptive device: Secondary | ICD-10-CM | POA: Diagnosis not present

## 2019-09-12 DIAGNOSIS — N898 Other specified noninflammatory disorders of vagina: Secondary | ICD-10-CM

## 2019-09-12 DIAGNOSIS — Z113 Encounter for screening for infections with a predominantly sexual mode of transmission: Secondary | ICD-10-CM

## 2019-09-12 DIAGNOSIS — N921 Excessive and frequent menstruation with irregular cycle: Secondary | ICD-10-CM

## 2019-09-12 LAB — WET PREP FOR TRICH, YEAST, CLUE

## 2019-09-12 NOTE — Progress Notes (Signed)
    Stephanie Mcdonald May 10, 1981 [id number]        38 y.o.  G2P2L2 Separated  RP: Menometrorrhagia on Paragard IUD  HPI: Heavy menses on Paragard IUD x 2013.  Would like to change to a Mirena IUD.  Separated, but sexually active with husband.  Odor with vaginal d/c.  Postcoital spotting.  Urine/BMs normal.   OB History  Gravida Para Term Preterm AB Living  2 2 2     2   SAB TAB Ectopic Multiple Live Births          2    # Outcome Date GA Lbr Len/2nd Weight Sex Delivery Anes PTL Lv  2 Term     F Vag-Spont  N LIV  1 Term     F Vag-Spont  N LIV    Past medical history,surgical history, problem list, medications, allergies, family history and social history were all reviewed and documented in the EPIC chart.   Directed ROS with pertinent positives and negatives documented in the history of present illness/assessment and plan.  Exam:  Vitals:   09/12/19 1610  BP: 126/84   General appearance:  Normal  Abdomen: Normal  Gynecologic exam: Vulva normal.  Speculum:  Cervix normal, IUD strings visible at the EO.  Gono-Chlam done.  Mild increase in vaginal d/c.  Wet prep done.  Bimanual exam:  Uterus AV, normal volume, mobile, mildly tender towards the left.  No adnexal mass.  Wet prep Negative  . Assessment/Plan:  39 y.o. G2P2002   1. Menometrorrhagia Menometrorrhagia on ParaGard IUD for 8 years.  Normal gynecologic exam except for mild increase in vaginal discharge and tenderness towards the left pelvis.  Pending gonorrhea and Chlamydia cultures.  Patient will follow up with a pelvic ultrasound to further investigate. - US Transvaginal Non-OB; Future  2. Vaginal odor Wet prep negative. - WET PREP FOR [place], YEAST, CLUE  3. Screening examination for venereal disease Strict condom use strongly recommended.  Declines other STD eyes screening. - C. trachomatis/N. gonorrhoeae RNA  4. Encounter for routine checking of intrauterine contraceptive device (IUD) ParaGard IUD in good  position with strings visible at the external os.  Would like to change to Mirena IUD.  Will check insurance.  Princess Bruins MD, 4:48 PM 09/12/2019

## 2019-09-13 ENCOUNTER — Encounter: Payer: Self-pay | Admitting: Obstetrics & Gynecology

## 2019-09-13 LAB — C. TRACHOMATIS/N. GONORRHOEAE RNA
C. trachomatis RNA, TMA: NOT DETECTED
N. gonorrhoeae RNA, TMA: NOT DETECTED

## 2019-09-13 NOTE — Patient Instructions (Signed)
1. Menometrorrhagia Menometrorrhagia on ParaGard IUD for 8 years.  Normal gynecologic exam except for mild increase in vaginal discharge and tenderness towards the left pelvis.  Pending gonorrhea and Chlamydia cultures.  Patient will follow up with a pelvic ultrasound to further investigate. - US Transvaginal Non-OB; Future  2. Vaginal odor Wet prep negative. - WET PREP FOR [name], YEAST, CLUE  3. Screening examination for venereal disease Strict condom use strongly recommended.  Declines other STD eyes screening. - C. trachomatis/N. gonorrhoeae RNA  4. Encounter for routine checking of intrauterine contraceptive device (IUD) ParaGard IUD in good position with strings visible at the external os.  Would like to change to Mirena IUD.  Will check insurance.  Stephanie Mcdonald, it was a pleasure seeing you today!  I will inform you of your results as soon as they are available.

## 2019-10-01 ENCOUNTER — Ambulatory Visit: Payer: BC Managed Care – PPO | Admitting: Obstetrics & Gynecology

## 2019-10-01 ENCOUNTER — Other Ambulatory Visit: Payer: BC Managed Care – PPO

## 2019-10-11 ENCOUNTER — Telehealth: Payer: Self-pay | Admitting: *Deleted

## 2019-10-11 NOTE — Telephone Encounter (Signed)
Patient called stating she will having new insurance on 10/22/19 worried her Mirena IUD cost my change. She discussed with Abigail Butts. I explained best to pelvic ultrasound first due to menometrorrhagia per office note on 09/12/19 then schedule Mirena IUD later. Patient adjusted appointments with front desk.

## 2019-10-14 ENCOUNTER — Other Ambulatory Visit: Payer: BC Managed Care – PPO

## 2019-10-14 ENCOUNTER — Ambulatory Visit: Payer: BC Managed Care – PPO | Admitting: Obstetrics & Gynecology

## 2019-10-30 ENCOUNTER — Ambulatory Visit: Payer: BC Managed Care – PPO | Admitting: Obstetrics & Gynecology

## 2019-11-05 ENCOUNTER — Other Ambulatory Visit: Payer: Self-pay

## 2019-11-07 ENCOUNTER — Other Ambulatory Visit: Payer: Self-pay

## 2019-11-07 ENCOUNTER — Ambulatory Visit (INDEPENDENT_AMBULATORY_CARE_PROVIDER_SITE_OTHER): Payer: BC Managed Care – PPO | Admitting: Obstetrics & Gynecology

## 2019-11-07 ENCOUNTER — Ambulatory Visit (INDEPENDENT_AMBULATORY_CARE_PROVIDER_SITE_OTHER): Payer: BC Managed Care – PPO

## 2019-11-07 DIAGNOSIS — Z30431 Encounter for routine checking of intrauterine contraceptive device: Secondary | ICD-10-CM | POA: Diagnosis not present

## 2019-11-07 DIAGNOSIS — N921 Excessive and frequent menstruation with irregular cycle: Secondary | ICD-10-CM

## 2019-11-07 NOTE — Progress Notes (Signed)
    Stephanie Mcdonald [birth year] [id number]        39 y.o.  G2P2002 Divorced  RP: Menometro with Paragard IUD for Pelvic US  HPI: On 09/12/2019 we noted: Heavy menses on Paragard IUD x 2013.  Would like to change to a Mirena IUD.  Separated, but sexually active with husband.  Odor with vaginal d/c.  Postcoital spotting. Urine/BMs normal.  Gono-Chlam Negative 08/2019.   OB History  Gravida Para Term Preterm AB Living  2 2 2     2   SAB TAB Ectopic Multiple Live Births          2    # Outcome Date GA Lbr Len/2nd Weight Sex Delivery Anes PTL Lv  2 Term     F Vag-Spont  N LIV  1 Term     F Vag-Spont  N LIV    Past medical history,surgical history, problem list, medications, allergies, family history and social history were all reviewed and documented in the EPIC chart.   Directed ROS with pertinent positives and negatives documented in the history of present illness/assessment and plan.  Exam:  There were no vitals filed for this visit. General appearance:  Normal  Pelvic US today: T/V images.  Anteverted uterus normal in size and shape with no myometrial mass.  The uterus is measured at 9.12 x 5.83 x 4.32 cm.  The endometrial lining is symmetrical measured at 9.84 mm with the intra uterine device in correct position.  No mass or thickening seen at the level of the endometrium.  Both ovaries are normal in size with normal follicular pattern.  The right ovary presents a dominant follicle measured at 1.8 cm.  No adnexal mass.  No free fluid in the posterior cul-de-sac.   Assessment/Plan:  39 y.o. G2P2002   1. Menometrorrhagia Pelvic US findings reviewed with patient.  The uterus is normal in size and shape with no myometrial mass.  The endometrial lining is symmetrical and normal at 9.84 mm with the IUD in correct position.  Both ovaries are normal with a follicular pattern.  F/U to remove Paragard IUD and insert Mirena IUD.  2. IUD check up Paragard IUD in good position x  2013.  Princess Bruins MD, 1:13 PM 11/07/2019

## 2019-11-11 ENCOUNTER — Encounter: Payer: Self-pay | Admitting: Obstetrics & Gynecology

## 2019-11-11 NOTE — Patient Instructions (Signed)
1. Menometrorrhagia Pelvic US findings reviewed with patient.  The uterus is normal in size and shape with no myometrial mass.  The endometrial lining is symmetrical and normal at 9.84 mm with the IUD in correct position.  Both ovaries are normal with a follicular pattern.  F/U to remove Paragard IUD and insert Mirena IUD.  2. IUD check up Paragard IUD in good position x 2013.  Toree, it was a pleasure seeing you today!

## 2019-11-15 ENCOUNTER — Other Ambulatory Visit: Payer: Self-pay

## 2019-11-18 ENCOUNTER — Ambulatory Visit (INDEPENDENT_AMBULATORY_CARE_PROVIDER_SITE_OTHER): Payer: BC Managed Care – PPO | Admitting: Obstetrics & Gynecology

## 2019-11-18 ENCOUNTER — Encounter: Payer: Self-pay | Admitting: Obstetrics & Gynecology

## 2019-11-18 ENCOUNTER — Other Ambulatory Visit: Payer: Self-pay

## 2019-11-18 VITALS — BP 126/78

## 2019-11-18 DIAGNOSIS — N921 Excessive and frequent menstruation with irregular cycle: Secondary | ICD-10-CM | POA: Diagnosis not present

## 2019-11-18 DIAGNOSIS — N760 Acute vaginitis: Secondary | ICD-10-CM | POA: Diagnosis not present

## 2019-11-18 DIAGNOSIS — N898 Other specified noninflammatory disorders of vagina: Secondary | ICD-10-CM | POA: Diagnosis not present

## 2019-11-18 DIAGNOSIS — Z30433 Encounter for removal and reinsertion of intrauterine contraceptive device: Secondary | ICD-10-CM | POA: Diagnosis not present

## 2019-11-18 DIAGNOSIS — B9689 Other specified bacterial agents as the cause of diseases classified elsewhere: Secondary | ICD-10-CM

## 2019-11-18 LAB — WET PREP FOR TRICH, YEAST, CLUE

## 2019-11-18 MED ORDER — TINIDAZOLE 500 MG PO TABS: 1000.0000 mg | ORAL_TABLET | Freq: Two times a day (BID) | ORAL | 1 refills | Status: AC

## 2019-11-18 NOTE — Progress Notes (Signed)
    Raynesha Tiedt 11/15/1980 [id number]        39 y.o.  G2P2002   RP: Menometrorrhagia for removal of Paragard IUD and insertion of Mirena IUD  HPI: Vaginal odor.  Menometrorrhagia on Paragard IUD.  No pelvic pain.  Desires to switch to Mirena IUD.   OB History  Gravida Para Term Preterm AB Living  2 2 2     2   SAB TAB Ectopic Multiple Live Births          2    # Outcome Date GA Lbr Len/2nd Weight Sex Delivery Anes PTL Lv  2 Term     F Vag-Spont  N LIV  1 Term     F Vag-Spont  N LIV    Past medical history,surgical history, problem list, medications, allergies, family history and social history were all reviewed and documented in the EPIC chart.   Directed ROS with pertinent positives and negatives documented in the history of present illness/assessment and plan.  Exam:  Vitals:   11/18/19 1519  BP: 126/78   General appearance:  Normal                                                                    IUD procedure note       Patient presented to the office today for removal of Paragard IUD and placement of Mirena IUD. The patient had previously been provided with literature information on this method of contraception. The risks benefits and pros and cons were discussed and all her questions were answered. She is fully aware that this form of contraception is 99% effective and is good for 5 years.  Pelvic exam: Vulva normal Vagina: No lesions or discharge Cervix: No lesions or discharge.  Easy removal of Paragard IUD by pulling on strings.  IUD complete, intact. Uterus: AV position Adnexa: No masses or tenderness Rectal exam: Not done  The cervix was cleansed with Betadine solution. Hurricane spray on the cervix.  A single-tooth tenaculum was placed on the anterior cervical lip. The IUD was shown to the patient and inserted in a sterile fashion.  Hysterometry with the IUD as being inserted was 7 cm.  The IUD string was trimmed. The single-tooth tenaculum was removed.  Patient was instructed to return back to the office in one to two months for follow up.       Wet prep:  Clue cells present   Assessment/Plan:  40 y.o. G2P2002   1. Menometrorrhagia Well managed with progesterone IUD.  2. Encounter for IUD removal and reinsertion Easy removal of ParaGard IUD which was complete and intact.  Insertion of Mirena IUD without difficulty.  Well-tolerated and no complication.  Postprocedure precautions reviewed.  Follow-up in 4 weeks for IUD check.  3. Vaginal odor Bacterial vaginosis per wet prep.  Will treat with tinidazole 500 mg 2 tablets twice a day for 2 days.  Usage reviewed and prescription sent to pharmacy.  Other orders - WET PREP FOR TRICH, YEAST, CLUE - tinidazole (TINDAMAX) 500 MG tablet; Take 2 tablets (1,000 mg total) by mouth 2 (two) times daily for 2 days.  Princess Bruins MD, 3:51 PM 11/18/2019

## 2019-11-22 ENCOUNTER — Encounter: Payer: Self-pay | Admitting: Obstetrics & Gynecology

## 2019-11-22 NOTE — Patient Instructions (Signed)
1. Menometrorrhagia Well managed with progesterone IUD.  2. Encounter for IUD removal and reinsertion Easy removal of ParaGard IUD which was complete and intact.  Insertion of Mirena IUD without difficulty.  Well-tolerated and no complication.  Postprocedure precautions reviewed.  Follow-up in 4 weeks for IUD check.  3. Vaginal odor Bacterial vaginosis per wet prep.  Will treat with tinidazole 500 mg 2 tablets twice a day for 2 days.  Usage reviewed and prescription sent to pharmacy.  Other orders - WET PREP FOR TRICH, YEAST, CLUE - tinidazole (TINDAMAX) 500 MG tablet; Take 2 tablets (1,000 mg total) by mouth 2 (two) times daily for 2 days.  Stephanie Mcdonald, it was a pleasure seeing you today!

## 2019-12-04 ENCOUNTER — Telehealth: Payer: Self-pay

## 2019-12-04 NOTE — Telephone Encounter (Signed)
Patient called because on 11/18/19 she had Paragard IUD removed and Mirena inserted and she has had very light bleeding every day since. I reassured her that this is normal as endometrium has been disturbed.  She will observe and call to report if it gets heavier that her period.

## 2019-12-12 DIAGNOSIS — Z114 Encounter for screening for human immunodeficiency virus [HIV]: Secondary | ICD-10-CM | POA: Diagnosis not present

## 2019-12-12 DIAGNOSIS — R5383 Other fatigue: Secondary | ICD-10-CM | POA: Diagnosis not present

## 2019-12-12 DIAGNOSIS — Z113 Encounter for screening for infections with a predominantly sexual mode of transmission: Secondary | ICD-10-CM | POA: Diagnosis not present

## 2019-12-12 DIAGNOSIS — R5381 Other malaise: Secondary | ICD-10-CM | POA: Diagnosis not present

## 2019-12-17 ENCOUNTER — Encounter: Payer: Self-pay | Admitting: Anesthesiology

## 2019-12-23 ENCOUNTER — Ambulatory Visit: Payer: BC Managed Care – PPO | Admitting: Obstetrics & Gynecology

## 2020-01-01 ENCOUNTER — Ambulatory Visit: Payer: BC Managed Care – PPO | Admitting: Obstetrics & Gynecology

## 2020-01-20 ENCOUNTER — Other Ambulatory Visit: Payer: Self-pay

## 2020-01-20 ENCOUNTER — Encounter: Payer: Self-pay | Admitting: Obstetrics & Gynecology

## 2020-01-20 ENCOUNTER — Ambulatory Visit (INDEPENDENT_AMBULATORY_CARE_PROVIDER_SITE_OTHER): Payer: BC Managed Care – PPO | Admitting: Obstetrics & Gynecology

## 2020-01-20 VITALS — BP 126/70 | Ht 64.5 in | Wt 143.0 lb

## 2020-01-20 DIAGNOSIS — Z113 Encounter for screening for infections with a predominantly sexual mode of transmission: Secondary | ICD-10-CM

## 2020-01-20 DIAGNOSIS — Z30431 Encounter for routine checking of intrauterine contraceptive device: Secondary | ICD-10-CM

## 2020-01-20 DIAGNOSIS — Z01419 Encounter for gynecological examination (general) (routine) without abnormal findings: Secondary | ICD-10-CM

## 2020-01-20 DIAGNOSIS — N898 Other specified noninflammatory disorders of vagina: Secondary | ICD-10-CM

## 2020-01-20 DIAGNOSIS — Z1151 Encounter for screening for human papillomavirus (HPV): Secondary | ICD-10-CM

## 2020-01-20 LAB — WET PREP FOR TRICH, YEAST, CLUE

## 2020-01-20 MED ORDER — TINIDAZOLE 500 MG PO TABS: 1000.0000 mg | ORAL_TABLET | Freq: Two times a day (BID) | ORAL | 1 refills | Status: AC

## 2020-01-20 NOTE — Progress Notes (Signed)
Stephanie Mcdonald 1981/01/25 [id number]   History:    39 y.o.  G2P2L2 Separated.  New boyfriend.  RP:  Established patient presenting for annual gyn exam   HPI:  Mirena IUD x 10/2019.  No vaginal bleeding currently.  New partner recently.  C/O vaginal odor.  No pelvic pain.  Urine/BMs normal.  Breasts normal.  Improved BMI at 24.17 now. Good fitness.   Past medical history,surgical history, family history and social history were all reviewed and documented in the EPIC chart.  Gynecologic History No LMP recorded. (Menstrual status: IUD).  Obstetric History OB History  Gravida Para Term Preterm AB Living  2 2 2     2   SAB TAB Ectopic Multiple Live Births          2    # Outcome Date GA Lbr Len/2nd Weight Sex Delivery Anes PTL Lv  2 Term     F Vag-Spont  N LIV  1 Term     F Vag-Spont  N LIV     ROS: A ROS was performed and pertinent positives and negatives are included in the history.  GENERAL: No fevers or chills. HEENT: No change in vision, no earache, sore throat or sinus congestion. NECK: No pain or stiffness. CARDIOVASCULAR: No chest pain or pressure. No palpitations. PULMONARY: No shortness of breath, cough or wheeze. GASTROINTESTINAL: No abdominal pain, nausea, vomiting or diarrhea, melena or bright red blood per rectum. GENITOURINARY: No urinary frequency, urgency, hesitancy or dysuria. MUSCULOSKELETAL: No joint or muscle pain, no back pain, no recent trauma. DERMATOLOGIC: No rash, no itching, no lesions. ENDOCRINE: No polyuria, polydipsia, no heat or cold intolerance. No recent change in weight. HEMATOLOGICAL: No anemia or easy bruising or bleeding. NEUROLOGIC: No headache, seizures, numbness, tingling or weakness. PSYCHIATRIC: No depression, no loss of interest in normal activity or change in sleep pattern.     Exam:   BP 126/70   Ht 5' 4.5" (1.638 m)   Wt 143 lb (64.9 kg)   BMI 24.17 kg/m   Body mass index is 24.17 kg/m.  General appearance : Well developed well  nourished female. No acute distress HEENT: Eyes: no retinal hemorrhage or exudates,  Neck supple, trachea midline, no carotid bruits, no thyroidmegaly Lungs: Clear to auscultation, no rhonchi or wheezes, or rib retractions  Heart: Regular rate and rhythm, no murmurs or gallops Breast:Examined in sitting and supine position were symmetrical in appearance, no palpable masses or tenderness,  no skin retraction, no nipple inversion, no nipple discharge, no skin discoloration, no axillary or supraclavicular lymphadenopathy Abdomen: no palpable masses or tenderness, no rebound or guarding Extremities: no edema or skin discoloration or tenderness  Pelvic: Vulva: Normal             Vagina: No gross lesions.  Mild vaginal d/c.  Wet prep done.  Cervix: No gross lesions or discharge.  IUD strings visible at the EO.  Pap/HPV/Gono-Chlam done.  Uterus  AV, normal size, shape and consistency, non-tender and mobile  Adnexa  Without masses or tenderness  Anus: Normal  Wet prep showing clue cells.   Assessment/Plan:  39 y.o. female for annual exam   1. Encounter for routine gynecological examination with Papanicolaou smear of cervix Normal gynecologic exam.  Pap test with high-risk HPV done.  Breast exam normal.  Good body mass index at 24.17.  Continue with fitness and healthy nutrition.  2. Encounter for routine checking of intrauterine contraceptive device (IUD) Mirena IUD since June 2021, well-tolerated and  in good location.  3. Screen for STD (sexually transmitted disease) Strict condom use recommended. - Gono-Chlam on Pap - HIV antibody (with reflex) - RPR - Hepatitis C Antibody - Hepatitis B Surface AntiGEN  4. Vaginal discharge Bacterial vaginosis confirmed by wet prep.  Diagnosis and management reviewed.  Decision to treat with tinidazole.  Usage reviewed and prescription sent to pharmacy. - WET PREP FOR TRICH, YEAST, CLUE  5. Vaginal odor As above. - WET PREP FOR [place], YEAST,  CLUE  Other orders - tinidazole (TINDAMAX) 500 MG tablet; Take 2 tablets (1,000 mg total) by mouth 2 (two) times daily for 2 days.  Princess Bruins MD, 4:51 PM 01/20/2020

## 2020-01-21 ENCOUNTER — Encounter: Payer: Self-pay | Admitting: Obstetrics & Gynecology

## 2020-01-21 DIAGNOSIS — Z113 Encounter for screening for infections with a predominantly sexual mode of transmission: Secondary | ICD-10-CM | POA: Diagnosis not present

## 2020-01-21 DIAGNOSIS — Z1151 Encounter for screening for human papillomavirus (HPV): Secondary | ICD-10-CM | POA: Diagnosis not present

## 2020-01-21 DIAGNOSIS — Z01419 Encounter for gynecological examination (general) (routine) without abnormal findings: Secondary | ICD-10-CM | POA: Diagnosis not present

## 2020-01-22 MED ORDER — METRONIDAZOLE 0.75 % VA GEL: VAGINAL | 0 refills | Status: DC

## 2020-01-23 LAB — PAP IG, CT-NG NAA, HPV HIGH-RISK
C. trachomatis RNA, TMA: NOT DETECTED
HPV DNA High Risk: NOT DETECTED
N. gonorrhoeae RNA, TMA: NOT DETECTED

## 2020-01-23 LAB — HIV ANTIBODY (ROUTINE TESTING W REFLEX): HIV 1&2 Ab, 4th Generation: NONREACTIVE

## 2020-01-23 LAB — RPR: RPR Ser Ql: NONREACTIVE

## 2020-01-23 LAB — HEPATITIS C ANTIBODY
Hepatitis C Ab: NONREACTIVE
SIGNAL TO CUT-OFF: 0.01 (ref <1.00)

## 2020-01-23 LAB — HEPATITIS B SURFACE ANTIGEN: Hepatitis B Surface Ag: NONREACTIVE

## 2020-04-21 MED ORDER — TINIDAZOLE 500 MG PO TABS: ORAL_TABLET | ORAL | 0 refills | Status: DC

## 2021-08-31 ENCOUNTER — Encounter: Payer: Self-pay | Admitting: Radiology

## 2021-08-31 ENCOUNTER — Ambulatory Visit (INDEPENDENT_AMBULATORY_CARE_PROVIDER_SITE_OTHER): Payer: BC Managed Care – PPO | Admitting: Radiology

## 2021-08-31 ENCOUNTER — Other Ambulatory Visit (HOSPITAL_COMMUNITY)
Admission: RE | Admit: 2021-08-31 | Discharge: 2021-08-31 | Disposition: A | Discharge status: Home / Self Care | Payer: BC Managed Care – PPO | Source: Ambulatory Visit | Attending: Radiology | Admitting: Radiology

## 2021-08-31 VITALS — BP 118/72 | Ht 64.0 in | Wt 132.0 lb

## 2021-08-31 DIAGNOSIS — Z30431 Encounter for routine checking of intrauterine contraceptive device: Secondary | ICD-10-CM

## 2021-08-31 DIAGNOSIS — Z01419 Encounter for gynecological examination (general) (routine) without abnormal findings: Secondary | ICD-10-CM | POA: Diagnosis not present

## 2021-08-31 NOTE — Progress Notes (Signed)
? ?  Stephanie Mcdonald [birth year] [id number] ? ? ?History:  41 y.o. G2P2 presents for annual exam.Occasionally has spotting between cycles. ? ?Gynecologic History ?Patient's last menstrual period was 08/01/2021 (approximate). ?  ?Contraception/Family planning: IUD ?Sexually active: yes ?Last Pap: 2021. Results were: normal, history of CIN 1 ? ? ?Obstetric History ?OB History  ?Gravida Para Term Preterm AB Living  ?_0 ?SAB IAB Ectopic Multiple Live Births  ?        2  ?  ?# Outcome Date GA Lbr Len/2nd Weight Sex Delivery Anes PTL Lv  ?2 Term     F Vag-Spont  N LIV  ?1 Term     F Vag-Spont  N LIV  ? ? ? ?The following portions of the patient's history were reviewed and updated as appropriate: allergies, current medications, past family history, past medical history, past social history, past surgical history, and problem list. ? ?Review of Systems ?Pertinent items noted in HPI and remainder of comprehensive ROS otherwise negative.  ? ?Past medical history, past surgical history, family history and social history were all reviewed and documented in the EPIC chart. ? ? ?Exam: ? ?Vitals:  ? 08/31/21 1515  ?BP: 118/72  ?Weight: 132 lb (59.9 kg)  ?Height: 5' 4" (1.626 m)  ? ?Body mass index is 22.66 kg/m?. ? ?General appearance:  Normal ?Thyroid:  Symmetrical, normal in size, without palpable masses or nodularity. ?Respiratory ? Auscultation:  Clear without wheezing or rhonchi ?Cardiovascular ? Auscultation:  Regular rate, without rubs, murmurs or gallops ? Edema/varicosities:  Not grossly evident ?Abdominal ? Soft,nontender, without masses, guarding or rebound. ? Liver/spleen:  No organomegaly noted ? Hernia:  None appreciated ? Skin ? Inspection:  Grossly normal ?Breasts: Examined lying and sitting.  ? Right: Without masses, retractions, nipple discharge or axillary adenopathy. ? ? Left: Without masses, retractions, nipple discharge or axillary adenopathy. ?Genitourinary  ? Inguinal/mons:  Normal without inguinal  adenopathy ? External genitalia:  Normal appearing vulva with no masses, tenderness, or lesions ? BUS/Urethra/Skene's glands:  Normal without masses or exudate ? Vagina:  Normal appearing with normal color and discharge, no lesions ? Cervix:  Normal appearing without discharge or lesions ? Uterus:  Normal in size, shape and contour.  Mobile, nontender ? Adnexa/parametria:   ?  Rt: Normal in size, without masses or tenderness. ?  Lt: Normal in size, without masses or tenderness. ? Anus and perineum: Normal ?  ?Patient informed chaperone available to be present for breast and pelvic exam. Patient has requested no chaperone to be present. Patient has been advised what will be completed during breast and pelvic exam.  ? ?Assessment/Plan:   ?1. Well woman exam with routine gynecological exam ? ?- Lipid Profile; Future ?- Comp Met (CMET); Future ?- HgB A1c; Future ?- Vitamin D (25 hydroxy); Future ?- Cytology - PAP( [place]) ? ?2. IUD check up ?Reassured strings seen ?Periods lighter with Mirena ?Spotting happens only when stressed ?  ? ? ?Discussed SBE, colonoscopy and DEXA screening as directed/appropriate. Recommend 111mns of exercise weekly, including weight bearing exercise. Encouraged the use of seatbelts and sunscreen. ?Return in 1 year for annual or as needed.  ? ?CRubbie BattiestB WHNP-BC 3:36 PM 08/31/2021  ?

## 2021-09-02 ENCOUNTER — Other Ambulatory Visit: Payer: Self-pay | Admitting: Radiology

## 2021-09-02 ENCOUNTER — Other Ambulatory Visit: Payer: BC Managed Care – PPO

## 2021-09-02 DIAGNOSIS — Z113 Encounter for screening for infections with a predominantly sexual mode of transmission: Secondary | ICD-10-CM

## 2021-09-02 DIAGNOSIS — Z01419 Encounter for gynecological examination (general) (routine) without abnormal findings: Secondary | ICD-10-CM

## 2021-09-02 LAB — CYTOLOGY - PAP
Chlamydia: NEGATIVE
Comment: NEGATIVE
Comment: NEGATIVE
Comment: NEGATIVE
Comment: NORMAL
Diagnosis: NEGATIVE
High risk HPV: NEGATIVE
Neisseria Gonorrhea: NEGATIVE
Trichomonas: NEGATIVE

## 2021-09-03 ENCOUNTER — Other Ambulatory Visit: Payer: Self-pay | Admitting: *Deleted

## 2021-09-03 LAB — COMPREHENSIVE METABOLIC PANEL
AG Ratio: 1.5 (calc) (ref 1.0–2.5)
ALT: 32 U/L — ABNORMAL HIGH (ref 6–29)
AST: 34 U/L — ABNORMAL HIGH (ref 10–30)
Albumin: 4.3 g/dL (ref 3.6–5.1)
Alkaline phosphatase (APISO): 59 U/L (ref 31–125)
BUN: 11 mg/dL (ref 7–25)
CO2: 25 mmol/L (ref 20–32)
Calcium: 8.9 mg/dL (ref 8.6–10.2)
Chloride: 105 mmol/L (ref 98–110)
Creat: 0.73 mg/dL (ref 0.50–0.97)
Globulin: 2.8 g/dL (calc) (ref 1.9–3.7)
Glucose, Bld: 105 mg/dL — ABNORMAL HIGH (ref 65–99)
Potassium: 4.2 mmol/L (ref 3.5–5.3)
Sodium: 136 mmol/L (ref 135–146)
Total Bilirubin: 0.7 mg/dL (ref 0.2–1.2)
Total Protein: 7.1 g/dL (ref 6.1–8.1)

## 2021-09-03 LAB — LIPID PANEL
Cholesterol: 160 mg/dL (ref <200)
HDL: 77 mg/dL (ref >=50)
LDL Cholesterol (Calc): 69 mg/dL (calc)
Non-HDL Cholesterol (Calc): 83 mg/dL (calc) (ref <130)
Total CHOL/HDL Ratio: 2.1 (calc) (ref <5.0)
Triglycerides: 62 mg/dL (ref <150)

## 2021-09-03 LAB — HIV ANTIBODY (ROUTINE TESTING W REFLEX): HIV 1&2 Ab, 4th Generation: NONREACTIVE

## 2021-09-03 LAB — HEMOGLOBIN A1C
Hgb A1c MFr Bld: 5.5 % of total Hgb (ref <5.7)
Mean Plasma Glucose: 111 mg/dL
eAG (mmol/L): 6.2 mmol/L

## 2021-09-03 LAB — HEPATITIS C ANTIBODY
Hepatitis C Ab: NONREACTIVE
SIGNAL TO CUT-OFF: 0.16 (ref <1.00)

## 2021-09-03 LAB — RPR: RPR Ser Ql: NONREACTIVE

## 2021-09-03 LAB — VITAMIN D 25 HYDROXY (VIT D DEFICIENCY, FRACTURES): Vit D, 25-Hydroxy: 20 ng/mL — ABNORMAL LOW (ref 30–100)

## 2021-09-03 MED ORDER — FLUCONAZOLE 150 MG PO TABS: 150.0000 mg | ORAL_TABLET | Freq: Every day | ORAL | 0 refills | Status: AC

## 2021-09-03 NOTE — Telephone Encounter (Signed)
Diflucan '150mg'$  po once no refills

## 2021-09-03 NOTE — Telephone Encounter (Signed)
Dose and directions of Diflucan would you like the patient to have I can send it to [facility] for you  ?

## 2021-09-07 ENCOUNTER — Other Ambulatory Visit: Payer: Self-pay

## 2021-09-07 ENCOUNTER — Encounter: Payer: Self-pay | Admitting: Physician Assistant

## 2021-09-07 DIAGNOSIS — R7989 Other specified abnormal findings of blood chemistry: Secondary | ICD-10-CM

## 2021-09-07 NOTE — Progress Notes (Signed)
Ok to repeat, lets plan for 4 weeks. Vaping should not elevate the levels that I know of but I would encouraged her to stop.

## 2021-09-13 NOTE — Telephone Encounter (Signed)
Stephanie Mcdonald patient would like to have Hep B lab. Please advise  ?

## 2021-09-14 ENCOUNTER — Other Ambulatory Visit: Payer: Self-pay | Admitting: Radiology

## 2021-09-14 DIAGNOSIS — Z1159 Encounter for screening for other viral diseases: Secondary | ICD-10-CM

## 2021-09-14 NOTE — Telephone Encounter (Signed)
Jami its several Hep B labs test in epic, I don't want to pick the incorrect order. Also what is the diagnosis? ?

## 2021-09-14 NOTE — Telephone Encounter (Signed)
I'll go ahead and put it in.

## 2021-09-23 ENCOUNTER — Ambulatory Visit: Payer: BC Managed Care – PPO | Admitting: Physician Assistant

## 2021-10-06 ENCOUNTER — Other Ambulatory Visit: Payer: BC Managed Care – PPO

## 2021-10-06 DIAGNOSIS — R7989 Other specified abnormal findings of blood chemistry: Secondary | ICD-10-CM

## 2021-10-06 DIAGNOSIS — Z1159 Encounter for screening for other viral diseases: Secondary | ICD-10-CM

## 2021-10-07 LAB — HEPATITIS B SURFACE ANTIBODY,QUALITATIVE: Hep B S Ab: NONREACTIVE

## 2021-10-07 LAB — AST: AST: 13 U/L (ref 10–30)

## 2021-10-07 LAB — ALT: ALT: 11 U/L (ref 6–29)

## 2021-11-01 ENCOUNTER — Ambulatory Visit: Payer: BC Managed Care – PPO | Admitting: Obstetrics & Gynecology
